# Patient Record
Sex: Female | Born: 1960 | Race: White | Hispanic: No | Marital: Married | Smoking: Never smoker
Health system: Southern US, Community
[De-identification: ages and names within clinical notes are randomized; demographics above are authoritative.]

## PROBLEM LIST (undated history)

## (undated) DIAGNOSIS — Z79890 Hormone replacement therapy (postmenopausal): Secondary | ICD-10-CM

## (undated) DIAGNOSIS — T7840XA Allergy, unspecified, initial encounter: Secondary | ICD-10-CM

## (undated) DIAGNOSIS — R87619 Unspecified abnormal cytological findings in specimens from cervix uteri: Secondary | ICD-10-CM

## (undated) DIAGNOSIS — D219 Benign neoplasm of connective and other soft tissue, unspecified: Secondary | ICD-10-CM

## (undated) DIAGNOSIS — K219 Gastro-esophageal reflux disease without esophagitis: Secondary | ICD-10-CM

## (undated) DIAGNOSIS — I10 Essential (primary) hypertension: Secondary | ICD-10-CM

## (undated) DIAGNOSIS — F419 Anxiety disorder, unspecified: Secondary | ICD-10-CM

## (undated) DIAGNOSIS — F329 Major depressive disorder, single episode, unspecified: Secondary | ICD-10-CM

## (undated) DIAGNOSIS — F32A Depression, unspecified: Secondary | ICD-10-CM

## (undated) DIAGNOSIS — Z5189 Encounter for other specified aftercare: Secondary | ICD-10-CM

## (undated) DIAGNOSIS — M1990 Unspecified osteoarthritis, unspecified site: Secondary | ICD-10-CM

## (undated) HISTORY — DX: Unspecified osteoarthritis, unspecified site: M19.90

## (undated) HISTORY — DX: Essential (primary) hypertension: I10

## (undated) HISTORY — DX: Anxiety disorder, unspecified: F41.9

## (undated) HISTORY — DX: Benign neoplasm of connective and other soft tissue, unspecified: D21.9

## (undated) HISTORY — PX: EYE SURGERY: SHX253

## (undated) HISTORY — DX: Hormone replacement therapy: Z79.890

## (undated) HISTORY — DX: Allergy, unspecified, initial encounter: T78.40XA

## (undated) HISTORY — DX: Gastro-esophageal reflux disease without esophagitis: K21.9

## (undated) HISTORY — DX: Encounter for other specified aftercare: Z51.89

## (undated) HISTORY — DX: Unspecified abnormal cytological findings in specimens from cervix uteri: R87.619

## (undated) HISTORY — DX: Depression, unspecified: F32.A

## (undated) HISTORY — PX: ARM WOUND REPAIR / CLOSURE: SUR1141

## (undated) HISTORY — DX: Major depressive disorder, single episode, unspecified: F32.9

---

## 1991-10-15 HISTORY — PX: KNEE ARTHROSCOPY: SUR90

## 1995-10-15 HISTORY — PX: TUBAL LIGATION: SHX77

## 1998-04-09 ENCOUNTER — Emergency Department (HOSPITAL_COMMUNITY): Admission: EM | Admit: 1998-04-09 | Discharge: 1998-04-09 | Payer: Self-pay | Admitting: Emergency Medicine

## 1999-03-21 ENCOUNTER — Other Ambulatory Visit: Admission: RE | Admit: 1999-03-21 | Discharge: 1999-03-21 | Payer: Self-pay | Admitting: *Deleted

## 1999-05-03 ENCOUNTER — Encounter: Admission: RE | Admit: 1999-05-03 | Discharge: 1999-05-16 | Payer: Self-pay | Admitting: *Deleted

## 2003-12-09 ENCOUNTER — Other Ambulatory Visit: Admission: RE | Admit: 2003-12-09 | Discharge: 2003-12-09 | Payer: Self-pay | Admitting: Obstetrics and Gynecology

## 2005-02-28 ENCOUNTER — Other Ambulatory Visit: Admission: RE | Admit: 2005-02-28 | Discharge: 2005-02-28 | Payer: Self-pay | Admitting: Obstetrics and Gynecology

## 2006-04-07 ENCOUNTER — Other Ambulatory Visit: Admission: RE | Admit: 2006-04-07 | Discharge: 2006-04-07 | Payer: Self-pay | Admitting: Obstetrics & Gynecology

## 2007-06-12 ENCOUNTER — Other Ambulatory Visit: Admission: RE | Admit: 2007-06-12 | Discharge: 2007-06-12 | Payer: Self-pay | Admitting: Obstetrics and Gynecology

## 2008-06-22 ENCOUNTER — Other Ambulatory Visit: Admission: RE | Admit: 2008-06-22 | Discharge: 2008-06-22 | Payer: Self-pay | Admitting: Obstetrics and Gynecology

## 2009-10-29 ENCOUNTER — Ambulatory Visit: Payer: Self-pay | Admitting: Diagnostic Radiology

## 2009-10-29 ENCOUNTER — Emergency Department (HOSPITAL_BASED_OUTPATIENT_CLINIC_OR_DEPARTMENT_OTHER): Admission: EM | Admit: 2009-10-29 | Discharge: 2009-10-29 | Payer: Self-pay | Admitting: Emergency Medicine

## 2011-09-18 ENCOUNTER — Ambulatory Visit (INDEPENDENT_AMBULATORY_CARE_PROVIDER_SITE_OTHER): Payer: BC Managed Care – PPO

## 2011-09-18 DIAGNOSIS — R05 Cough: Secondary | ICD-10-CM

## 2011-09-18 DIAGNOSIS — R059 Cough, unspecified: Secondary | ICD-10-CM

## 2011-09-18 DIAGNOSIS — J189 Pneumonia, unspecified organism: Secondary | ICD-10-CM

## 2011-09-23 ENCOUNTER — Ambulatory Visit (INDEPENDENT_AMBULATORY_CARE_PROVIDER_SITE_OTHER): Payer: BC Managed Care – PPO

## 2011-09-23 DIAGNOSIS — R51 Headache: Secondary | ICD-10-CM

## 2011-09-23 DIAGNOSIS — R059 Cough, unspecified: Secondary | ICD-10-CM

## 2011-09-23 DIAGNOSIS — R05 Cough: Secondary | ICD-10-CM

## 2011-09-23 DIAGNOSIS — J159 Unspecified bacterial pneumonia: Secondary | ICD-10-CM

## 2011-11-12 ENCOUNTER — Ambulatory Visit (INDEPENDENT_AMBULATORY_CARE_PROVIDER_SITE_OTHER): Payer: BC Managed Care – PPO | Admitting: Internal Medicine

## 2011-11-12 ENCOUNTER — Ambulatory Visit: Admission: RE | Admit: 2011-11-12 | Payer: Self-pay | Source: Ambulatory Visit

## 2011-11-12 ENCOUNTER — Ambulatory Visit: Payer: BC Managed Care – PPO

## 2011-11-12 VITALS — BP 134/74 | HR 60 | Temp 98.9°F | Resp 16 | Ht 65.25 in | Wt 200.8 lb

## 2011-11-12 DIAGNOSIS — R059 Cough, unspecified: Secondary | ICD-10-CM

## 2011-11-12 DIAGNOSIS — R05 Cough: Secondary | ICD-10-CM

## 2011-11-12 DIAGNOSIS — Z79890 Hormone replacement therapy (postmenopausal): Secondary | ICD-10-CM | POA: Insufficient documentation

## 2011-11-12 DIAGNOSIS — R053 Chronic cough: Secondary | ICD-10-CM

## 2011-11-12 DIAGNOSIS — F411 Generalized anxiety disorder: Secondary | ICD-10-CM

## 2011-11-12 DIAGNOSIS — F32A Depression, unspecified: Secondary | ICD-10-CM | POA: Insufficient documentation

## 2011-11-12 DIAGNOSIS — I10 Essential (primary) hypertension: Secondary | ICD-10-CM

## 2011-11-12 DIAGNOSIS — F3289 Other specified depressive episodes: Secondary | ICD-10-CM

## 2011-11-12 DIAGNOSIS — F329 Major depressive disorder, single episode, unspecified: Secondary | ICD-10-CM

## 2011-11-12 DIAGNOSIS — F419 Anxiety disorder, unspecified: Secondary | ICD-10-CM

## 2011-11-12 HISTORY — DX: Hormone replacement therapy: Z79.890

## 2011-11-12 NOTE — Progress Notes (Signed)
Jillian Steele is a 51 year old female who complains of a persistent cough with fatigue over the past 2 months. She was treated for sinusitis in November and pneumonia in December and it is now time to repeat her chest x-ray to see if the pneumonia cleared. She continues to be congested in the head and neck areas and complained of shortness of breath with activity and cough both day and night. The cough is not productive but is uncomfortable. She has a history of wheezing with these illnesses and responded to prednisone in December. She has not lost weight. She is not having night sweats except for those associated with her known menopause. She is a nonsmoker.  It is interesting that she also suffers from depression and is on Lexapro. She claims great stress over the past 2 or 3 months do to her daughter's upcoming wedding. She has to pay for this and she has no money and her daughter as not being very helpful in this regard. This sometimes interferes with her sleep as does her nocturnal cough.  Physical examination reveals a healthy woman who is overweight. Her eyes ears and throat are clear. Her nose is very congested and there is purulent discharge. Her sinuses are tender in both maxillary and frontal areas. Her lungs are clear to auscultation and there is no wheezing except at the very end of forced expiration. Her heart exam is normal.  Problem #1 prolonged cough  Problem #2 sinusitis  Problem #3 reactive airway disease  Problem #4 depression  Problem #5 obesity  Problem #6 hormone replacement therapy  Problem #7 hypertension  X-ray of her chest was obtained and reveals that the pneumonia has cleared. There is no new infiltrate.  The plan is to treat her with prednisone amoxicillin and Tussionex and followup if not successful.

## 2011-11-28 ENCOUNTER — Ambulatory Visit: Payer: BC Managed Care – PPO

## 2011-11-28 ENCOUNTER — Ambulatory Visit (INDEPENDENT_AMBULATORY_CARE_PROVIDER_SITE_OTHER): Payer: BC Managed Care – PPO | Admitting: Emergency Medicine

## 2011-11-28 VITALS — BP 139/86 | HR 73 | Temp 98.4°F | Resp 16 | Ht 65.25 in | Wt 206.6 lb

## 2011-11-28 DIAGNOSIS — I10 Essential (primary) hypertension: Secondary | ICD-10-CM

## 2011-11-28 DIAGNOSIS — M549 Dorsalgia, unspecified: Secondary | ICD-10-CM

## 2011-11-28 DIAGNOSIS — F32A Depression, unspecified: Secondary | ICD-10-CM

## 2011-11-28 DIAGNOSIS — F329 Major depressive disorder, single episode, unspecified: Secondary | ICD-10-CM

## 2011-11-28 MED ORDER — LISINOPRIL 10 MG PO TABS: 10.0000 mg | ORAL_TABLET | Freq: Every day | ORAL | Status: DC

## 2011-11-28 MED ORDER — ESCITALOPRAM OXALATE 20 MG PO TABS: 20.0000 mg | ORAL_TABLET | Freq: Every day | ORAL | Status: DC

## 2011-11-28 MED ORDER — MELOXICAM 15 MG PO TABS: 15.0000 mg | ORAL_TABLET | Freq: Every day | ORAL | Status: DC

## 2011-11-28 MED ORDER — METHOCARBAMOL 500 MG PO TABS: 500.0000 mg | ORAL_TABLET | Freq: Four times a day (QID) | ORAL | Status: DC

## 2011-11-28 NOTE — Progress Notes (Signed)
  Subjective:    Patient ID: Jillian Steele, female    DOB: [birth year], 51 y.o.   MRN: [id number]  HPI patient presents with onset Tuesday of pain in her lower back. Apparently patient leaned over with their cat litter box and felt a pain in her lower back . The pain originates in her LS spine and radiates down her left leg.    Review of Systems patient also needs refills on her Lexapro and lisinopril.     Objective:   Physical Exam physical exam reveals tenderness over the lower lumbar spine especially on the left. There is some radicular pain into the left leg. Motor strength is 5 out of 5. Deep tendon reflexes are 0-1 but symmetrical.        Assessment & Plan:  Assessment as LS strain with some radicular symptoms into the left leg. We'll try and get a film over her back. Also refill her medications of

## 2011-11-28 NOTE — Progress Notes (Signed)
Addended by: Lesle Chris A on: 11/28/2011 09:13 AM   Modules accepted: Orders

## 2011-11-28 NOTE — Patient Instructions (Signed)
Back Pain, Adult Low back pain is very common. About 1 in 5 people have back pain.The cause of low back pain is rarely dangerous. The pain often gets better over time.About half of people with a sudden onset of back pain feel better in just 2 weeks. About 8 in 10 people feel better by 6 weeks.  CAUSES Some common causes of back pain include:  Strain of the muscles or ligaments supporting the spine.   Wear and tear (degeneration) of the spinal discs.   Arthritis.   Direct injury to the back.  DIAGNOSIS Most of the time, the direct cause of low back pain is not known.However, back pain can be treated effectively even when the exact cause of the pain is unknown.Answering your caregiver's questions about your overall health and symptoms is one of the most accurate ways to make sure the cause of your pain is not dangerous. If your caregiver needs more information, he or she may order lab work or imaging tests (X-rays or MRIs).However, even if imaging tests show changes in your back, this usually does not require surgery. HOME CARE INSTRUCTIONS For many people, back pain returns.Since low back pain is rarely dangerous, it is often a condition that people can learn to manageon their own.   Remain active. It is stressful on the back to sit or stand in one place. Do not sit, drive, or stand in one place for more than 30 minutes at a time. Take short walks on level surfaces as soon as pain allows.Try to increase the length of time you walk each day.   Do not stay in bed.Resting more than 1 or 2 days can delay your recovery.   Do not avoid exercise or work.Your body is made to move.It is not dangerous to be active, even though your back may hurt.Your back will likely heal faster if you return to being active before your pain is gone.   Pay attention to your body when you bend and lift. Many people have less discomfortwhen lifting if they bend their knees, keep the load close to their  bodies,and avoid twisting. Often, the most comfortable positions are those that put less stress on your recovering back.   Find a comfortable position to sleep. Use a firm mattress and lie on your side with your knees slightly bent. If you lie on your back, put a pillow under your knees.   Only take over-the-counter or prescription medicines as directed by your caregiver. Over-the-counter medicines to reduce pain and inflammation are often the most helpful.Your caregiver may prescribe muscle relaxant drugs.These medicines help dull your pain so you can more quickly return to your normal activities and healthy exercise.   Put ice on the injured area.   Put ice in a plastic bag.   Place a towel between your skin and the bag.   Leave the ice on for 15 to 20 minutes, 3 to 4 times a day for the first 2 to 3 days. After that, ice and heat may be alternated to reduce pain and spasms.   Ask your caregiver about trying back exercises and gentle massage. This may be of some benefit.   Avoid feeling anxious or stressed.Stress increases muscle tension and can worsen back pain.It is important to recognize when you are anxious or stressed and learn ways to manage it.Exercise is a great option.  SEEK MEDICAL CARE IF:  You have pain that is not relieved with rest or medicine.   You have   pain that does not improve in 1 week.   You have new symptoms.   You are generally not feeling well.  SEEK IMMEDIATE MEDICAL CARE IF:   You have pain that radiates from your back into your legs.   You develop new bowel or bladder control problems.   You have unusual weakness or numbness in your arms or legs.   You develop nausea or vomiting.   You develop abdominal pain.   You feel faint.  Document Released: 09/30/2005 Document Revised: 06/12/2011 Document Reviewed: 02/18/2011 [employer] Patient Information 2012 [employer]. 

## 2011-11-29 ENCOUNTER — Other Ambulatory Visit: Payer: Self-pay | Admitting: Family Medicine

## 2011-11-29 DIAGNOSIS — F32A Depression, unspecified: Secondary | ICD-10-CM

## 2011-11-29 DIAGNOSIS — F329 Major depressive disorder, single episode, unspecified: Secondary | ICD-10-CM

## 2011-11-29 DIAGNOSIS — M549 Dorsalgia, unspecified: Secondary | ICD-10-CM

## 2011-11-29 DIAGNOSIS — I10 Essential (primary) hypertension: Secondary | ICD-10-CM

## 2011-11-29 MED ORDER — METHOCARBAMOL 500 MG PO TABS: 500.0000 mg | ORAL_TABLET | Freq: Four times a day (QID) | ORAL | Status: AC

## 2011-11-29 MED ORDER — MELOXICAM 15 MG PO TABS: 15.0000 mg | ORAL_TABLET | Freq: Every day | ORAL | Status: DC

## 2011-11-29 MED ORDER — ESCITALOPRAM OXALATE 20 MG PO TABS: 20.0000 mg | ORAL_TABLET | Freq: Every day | ORAL | Status: DC

## 2011-11-29 MED ORDER — LISINOPRIL 10 MG PO TABS: 10.0000 mg | ORAL_TABLET | Freq: Every day | ORAL | Status: DC

## 2011-12-26 ENCOUNTER — Other Ambulatory Visit: Payer: Self-pay | Admitting: Physician Assistant

## 2011-12-26 DIAGNOSIS — F329 Major depressive disorder, single episode, unspecified: Secondary | ICD-10-CM

## 2011-12-26 DIAGNOSIS — F32A Depression, unspecified: Secondary | ICD-10-CM

## 2011-12-26 DIAGNOSIS — I10 Essential (primary) hypertension: Secondary | ICD-10-CM

## 2011-12-26 MED ORDER — ESCITALOPRAM OXALATE 20 MG PO TABS: 20.0000 mg | ORAL_TABLET | Freq: Every day | ORAL | Status: DC

## 2011-12-26 MED ORDER — LISINOPRIL 10 MG PO TABS: 10.0000 mg | ORAL_TABLET | Freq: Every day | ORAL | Status: DC

## 2012-10-06 ENCOUNTER — Ambulatory Visit (INDEPENDENT_AMBULATORY_CARE_PROVIDER_SITE_OTHER): Payer: BC Managed Care – PPO | Admitting: Emergency Medicine

## 2012-10-06 ENCOUNTER — Ambulatory Visit: Payer: BC Managed Care – PPO

## 2012-10-06 VITALS — BP 134/64 | HR 60 | Temp 98.4°F | Resp 16 | Ht 65.5 in | Wt 205.0 lb

## 2012-10-06 DIAGNOSIS — M79671 Pain in right foot: Secondary | ICD-10-CM

## 2012-10-06 DIAGNOSIS — M25562 Pain in left knee: Secondary | ICD-10-CM

## 2012-10-06 DIAGNOSIS — M722 Plantar fascial fibromatosis: Secondary | ICD-10-CM

## 2012-10-06 DIAGNOSIS — M25569 Pain in unspecified knee: Secondary | ICD-10-CM

## 2012-10-06 DIAGNOSIS — M79609 Pain in unspecified limb: Secondary | ICD-10-CM

## 2012-10-06 MED ORDER — MELOXICAM 15 MG PO TABS: 15.0000 mg | ORAL_TABLET | Freq: Every day | ORAL | Status: DC

## 2012-10-06 NOTE — Patient Instructions (Addendum)
Begin taking Mobic (meloxicam daily) for pain/inflammation.  Do not take any additional Advil/Motrin/ibuprofen/Aleve while taking this.  Let's do the Mobic daily for 2-3 weeks.  I have put in a referral to [facility] for your knee.  I have also referred you for physical therapy at [facility] for the plantar fasciitis of your right foot.  In the mean time, ice and elevation for 15-20 minutes at a time, 3 times per day.  Begin doing the exercises that I gave you.  May also consider a night splint.

## 2012-10-06 NOTE — Progress Notes (Signed)
Subjective:    Patient ID: Elige Radon, female    DOB: 15-May-1961, 51 y.o.   MRN: [id number]  HPI   Ms. Hinz is a very pleasant 51 yr old female here with left knee and right heel pain.  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  She had an arthroscopic surgery for this in 1996.  She was evaluated at [facility] about a year ago and had an MRI.  At that time, it was felt that the damage was not bad enough to warrant further surgery.  Over the last several months patient has felt that the knee pain is worsening.  Often feels like the knee is going to give out.  States that over the last 6 months she has actually fallen a couple times because the knee has given out.  Would like a referral back to [facility] but would like to see a different doctor if possible.  Right foot - pt has known plantar fasciitis.  Symptoms have flared up in the last couple months but have worsened in the last several days.  She was actually in [place] yesterday but came home early due to the pain.  Has been using ice and ibuprofen.  Tries to stretch the calf when possible.  Works 10 hours per day on her feet.  Has to wear work-approved shoes, which are not very supportive.  Very concerned about a heel spur, would like an x-ray today for that reason.     Review of Systems  Constitutional: Negative for fever and chills.  HENT: Negative.   Respiratory: Negative.   Cardiovascular: Negative.   Gastrointestinal: Negative.   Musculoskeletal: Positive for arthralgias (left knee, right foot).  Neurological: Negative.        Objective:   Physical Exam  Constitutional: She is oriented to person, place, and time. She appears well-developed and well-nourished. No distress.  HENT:  Head: Normocephalic and atraumatic.  Eyes: Conjunctivae normal are normal. No scleral icterus.  Pulmonary/Chest: Effort normal.  Musculoskeletal:       Right knee: Normal.       Left knee: She exhibits  normal range of motion, no swelling, no effusion, no ecchymosis, no deformity, no erythema, no LCL laxity, normal patellar mobility, no bony tenderness and no MCL laxity. tenderness (pes anserine bursa) found. No MCL, no LCL and no patellar tendon tenderness noted.       Right ankle: Normal. Achilles tendon normal.       Left ankle: Normal.       Right foot: She exhibits tenderness (heel; insertion of plantar fascia). She exhibits normal range of motion, no bony tenderness, no swelling, normal capillary refill and no deformity.       Left foot: Normal.       TTP only over pes anserine bursa; ROM is full; negative anterior drawer, negative Lachman; negative McMurray  Neurological: She is alert and oriented to person, place, and time.  Skin: Skin is warm and dry.  Psychiatric: She has a normal mood and affect. Her behavior is normal.      Filed Vitals:   10/06/12 0819  BP: 134/64  Pulse: 60  Temp: 98.4 F (36.9 C)  Resp: 16      [facility] reading (PRIMARY) by  Dr. Cleta Alberts - heel spur      Assessment & Plan:   1. Pain of right heel  DG Foot Complete Right, meloxicam (MOBIC) 15 MG tablet, Ambulatory referral to Physical Therapy  2.  Pain in left knee  meloxicam (MOBIC) 15 MG tablet, Ambulatory referral to Orthopedic Surgery  3. Plantar fasciitis of right foot  meloxicam (MOBIC) 15 MG tablet, Ambulatory referral to Physical Therapy     Ms. Lohmann is a very pleasant 51 yr old female with left knee pain and plantar fasciitis of the right foot.  Knee exam was largely normal aside from TTP of pes anserine bursa.  Suspect bursitis could be contributing to her pain.  Given her complaint of the knee giving out though, will refer her back to [facility] for further evaluation.    Mobic daily for pain/inflammation of the knee/plantar fascia  PT referral for plantar fasciitis.  I have given her stretches/exercise to do in the mean time.  Discussed the possibility of using a night splint as  well.  Pt may buy at medical supply store or online.  Will do PT at [facility] for continuity.  Pt will RTC if worsening or not improving.

## 2012-11-29 ENCOUNTER — Ambulatory Visit (INDEPENDENT_AMBULATORY_CARE_PROVIDER_SITE_OTHER): Payer: BC Managed Care – PPO | Admitting: Physician Assistant

## 2012-11-29 VITALS — BP 134/90 | HR 98 | Temp 98.3°F | Resp 16 | Ht 66.0 in | Wt 207.8 lb

## 2012-11-29 DIAGNOSIS — H9319 Tinnitus, unspecified ear: Secondary | ICD-10-CM

## 2012-11-29 DIAGNOSIS — J329 Chronic sinusitis, unspecified: Secondary | ICD-10-CM

## 2012-11-29 DIAGNOSIS — R51 Headache: Secondary | ICD-10-CM

## 2012-11-29 MED ORDER — AMOXICILLIN-POT CLAVULANATE 875-125 MG PO TABS: 1.0000 | ORAL_TABLET | Freq: Two times a day (BID) | ORAL | Status: DC

## 2012-11-29 NOTE — Progress Notes (Signed)
Subjective:    Patient ID: Elige Radon, female    DOB: 1960-12-07, 52 y.o.   MRN: [id number]  HPI   Ms. Grumbine is a 52 yr old female here with concern for sinusitis.  "Feels like I'm in a drum."  States she feels "woozy".  Denies dizziness or vertigo, but states she feels somewhat off balance.  States she has a roaring in both ears. Denies hearing loss.  Describes it as Paramedic".  This has been off and on for about a week.  Feels pressure in frontal sinuses.  Denies nasal congestion or nasal drainage.  No cough, SOB, wheezing, fever, chills, sore throat, or ear pain.  No numbness, tingling, weakness.  States that she is not really prone to sinus infections but that this feels like her previous episodes of sinusitis.  Has been using Alka Seltzer Plus but this is not helping.     Review of Systems  Constitutional: Negative for fever and chills.  HENT: Positive for postnasal drip, sinus pressure (frontal) and tinnitus. Negative for congestion, sore throat, rhinorrhea, neck pain and neck stiffness.   Respiratory: Negative for cough, shortness of breath and wheezing.   Cardiovascular: Negative.   Gastrointestinal: Negative.   Musculoskeletal: Negative.   Skin: Negative.   Neurological: Positive for dizziness and headaches. Negative for weakness, light-headedness and numbness.       Objective:   Physical Exam  Vitals reviewed. Constitutional: She is oriented to person, place, and time. She appears well-developed and well-nourished. No distress.  HENT:  Head: Normocephalic and atraumatic.  Right Ear: Tympanic membrane and ear canal normal.  Left Ear: Tympanic membrane and ear canal normal.  Nose: Right sinus exhibits frontal sinus tenderness. Right sinus exhibits no maxillary sinus tenderness. Left sinus exhibits frontal sinus tenderness. Left sinus exhibits no maxillary sinus tenderness.  Mouth/Throat: Uvula is midline, oropharynx is clear and moist and mucous membranes are normal.  Eyes:  Conjunctivae are normal. No scleral icterus.  Neck: Neck supple.  Cardiovascular: Normal rate, regular rhythm and normal heart sounds.  Exam reveals no gallop and no friction rub.   No murmur heard. Pulmonary/Chest: Effort normal and breath sounds normal. She has no wheezes. She has no rales.  Lymphadenopathy:    She has no cervical adenopathy.  Neurological: She is alert and oriented to person, place, and time.  Skin: Skin is warm and dry.  Psychiatric: She has a normal mood and affect. Her behavior is normal.     Filed Vitals:   11/29/12 1648  BP: 134/90  Pulse: 98  Temp: 98.3 F (36.8 C)  Resp: 16        Assessment & Plan:  Sinusitis - Plan: amoxicillin-clavulanate (AUGMENTIN) 875-125 MG per tablet  Headache  Tinnitus   Ms. Gilmore is a pleasant 52 yr old female here with headache and roaring tinnitus.  Pt denies dizziness or vertigo.  She has concern for sinus infection, and states that this episode feels similar to previous episodes of sinusitis.  This certainly is not a classic presentation of sinusitis, but I think it's reasonable to try a course of abx, esp since that has helped in the past.  I certainly have some concern for other causes of tinnitus, particularly Meniere's disease as she also has a sense of being off balance.  She does not have any focal neurologic deficits.  Discussed with pt that if abx do not resolve the problem we will need to investigate further.  If pt is worsening, not improving, or  developing new symptoms, she will RTC.

## 2012-11-29 NOTE — Patient Instructions (Addendum)
Begin taking the Augmentin as directed.  Take with food to reduce stomach upset.  Be sure to finish the full course.  Plenty of fluids.  Please let us know if worsening or not improving   Sinusitis Sinusitis is redness, soreness, and swelling (inflammation) of the paranasal sinuses. Paranasal sinuses are air pockets within the bones of your face (beneath the eyes, the middle of the forehead, or above the eyes). In healthy paranasal sinuses, mucus is able to drain out, and air is able to circulate through them by way of your nose. However, when your paranasal sinuses are inflamed, mucus and air can become trapped. This can allow bacteria and other germs to grow and cause infection. Sinusitis can develop quickly and last only a short time (acute) or continue over a long period (chronic). Sinusitis that lasts for more than 12 weeks is considered chronic.  CAUSES  Causes of sinusitis include:  Allergies.  Structural abnormalities, such as displacement of the cartilage that separates your nostrils (deviated septum), which can decrease the air flow through your nose and sinuses and affect sinus drainage.  Functional abnormalities, such as when the small hairs (cilia) that line your sinuses and help remove mucus do not work properly or are not present. SYMPTOMS  Symptoms of acute and chronic sinusitis are the same. The primary symptoms are pain and pressure around the affected sinuses. Other symptoms include:  Upper toothache.  Earache.  Headache.  Bad breath.  Decreased sense of smell and taste.  A cough, which worsens when you are lying flat.  Fatigue.  Fever.  Thick drainage from your nose, which often is green and may contain pus (purulent).  Swelling and warmth over the affected sinuses. DIAGNOSIS  Your caregiver will perform a physical exam. During the exam, your caregiver may:  Look in your nose for signs of abnormal growths in your nostrils (nasal polyps).  Tap over the  affected sinus to check for signs of infection.  View the inside of your sinuses (endoscopy) with a special imaging device with a light attached (endoscope), which is inserted into your sinuses. If your caregiver suspects that you have chronic sinusitis, one or more of the following tests may be recommended:  Allergy tests.  Nasal culture A sample of mucus is taken from your nose and sent to a lab and screened for bacteria.  Nasal cytology A sample of mucus is taken from your nose and examined by your caregiver to determine if your sinusitis is related to an allergy. TREATMENT  Most cases of acute sinusitis are related to a viral infection and will resolve on their own within 10 days. Sometimes medicines are prescribed to help relieve symptoms (pain medicine, decongestants, nasal steroid sprays, or saline sprays).  However, for sinusitis related to a bacterial infection, your caregiver will prescribe antibiotic medicines. These are medicines that will help kill the bacteria causing the infection.  Rarely, sinusitis is caused by a fungal infection. In theses cases, your caregiver will prescribe antifungal medicine. For some cases of chronic sinusitis, surgery is needed. Generally, these are cases in which sinusitis recurs more than 3 times per year, despite other treatments. HOME CARE INSTRUCTIONS   Drink plenty of water. Water helps thin the mucus so your sinuses can drain more easily.  Use a humidifier.  Inhale steam 3 to 4 times a day (for example, sit in the bathroom with the shower running).  Apply a warm, moist washcloth to your face 3 to 4 times a day,  or as directed by your caregiver.  Use saline nasal sprays to help moisten and clean your sinuses.  Take over-the-counter or prescription medicines for pain, discomfort, or fever only as directed by your caregiver. SEEK IMMEDIATE MEDICAL CARE IF:  You have increasing pain or severe headaches.  You have nausea, vomiting, or  drowsiness.  You have swelling around your face.  You have vision problems.  You have a stiff neck.  You have difficulty breathing. MAKE SURE YOU:   Understand these instructions.  Will watch your condition.  Will get help right away if you are not doing well or get worse. Document Released: 09/30/2005 Document Revised: 12/23/2011 Document Reviewed: 10/15/2011 [facility] Patient Information 2013 [place], [place].

## 2012-12-25 ENCOUNTER — Other Ambulatory Visit: Payer: Self-pay | Admitting: Gynecology

## 2012-12-25 LAB — BASIC METABOLIC PANEL
BUN: 14 mg/dL (ref 4–21)
Creatinine: 0.8 mg/dL (ref 0.5–1.1)
Potassium: 4.3 mmol/L (ref 3.4–5.3)

## 2012-12-25 LAB — LIPID PANEL
Cholesterol: 181 mg/dL (ref 0–200)
HDL: 49 mg/dL (ref 35–70)
LDl/HDL Ratio: 3.7
Triglycerides: 92 mg/dL (ref 40–160)

## 2012-12-25 LAB — HEPATIC FUNCTION PANEL
ALT: 23 U/L (ref 7–35)
AST: 16 U/L (ref 13–35)
Alkaline Phosphatase: 90 U/L (ref 25–125)
Bilirubin, Total: 0.5 mg/dL

## 2012-12-29 LAB — HEMOGLOBIN, FINGERSTICK: Hemoglobin, fingerstick: 13.7 g/dL (ref 12.0–16.0)

## 2013-01-08 ENCOUNTER — Telehealth: Payer: Self-pay | Admitting: Radiology

## 2013-01-08 ENCOUNTER — Ambulatory Visit (INDEPENDENT_AMBULATORY_CARE_PROVIDER_SITE_OTHER): Payer: BC Managed Care – PPO | Admitting: Physician Assistant

## 2013-01-08 VITALS — BP 122/82 | HR 77 | Temp 98.3°F | Resp 16 | Ht 66.0 in | Wt 204.0 lb

## 2013-01-08 DIAGNOSIS — F32A Depression, unspecified: Secondary | ICD-10-CM

## 2013-01-08 DIAGNOSIS — F419 Anxiety disorder, unspecified: Secondary | ICD-10-CM

## 2013-01-08 DIAGNOSIS — F329 Major depressive disorder, single episode, unspecified: Secondary | ICD-10-CM

## 2013-01-08 DIAGNOSIS — F411 Generalized anxiety disorder: Secondary | ICD-10-CM

## 2013-01-08 DIAGNOSIS — I10 Essential (primary) hypertension: Secondary | ICD-10-CM

## 2013-01-08 DIAGNOSIS — Z1211 Encounter for screening for malignant neoplasm of colon: Secondary | ICD-10-CM

## 2013-01-08 MED ORDER — ESCITALOPRAM OXALATE 20 MG PO TABS: 20.0000 mg | ORAL_TABLET | Freq: Every day | ORAL | Status: DC

## 2013-01-08 MED ORDER — LISINOPRIL 10 MG PO TABS: 10.0000 mg | ORAL_TABLET | Freq: Every day | ORAL | Status: DC

## 2013-01-08 MED ORDER — BUPROPION HCL ER (XL) 150 MG PO TB24: 150.0000 mg | ORAL_TABLET | Freq: Every day | ORAL | Status: DC

## 2013-01-08 MED ORDER — ALPRAZOLAM 0.5 MG PO TABS: 0.5000 mg | ORAL_TABLET | Freq: Every day | ORAL | Status: DC | PRN

## 2013-01-08 NOTE — Progress Notes (Signed)
Subjective:    Patient ID: Jillian Steele, female    DOB: [birth year], 52 y.o.   MRN: [id number]  HPI  This 52 y.o. female presents for evaluation of HTN, anxiety and depression.  Has had a very stressful year.  Her daughter became engaged and was planning a wedding.  The patient had paid in excess of $10,000 for the wedding, only to discover than her daughter's plan was to NOT get married, rather to make a public statement against the [place] ban on gay marriage. Unfortunately, her daughter didn't understand her frustration, and ultimately they are no longer communicating.  Then, a kitchen renovation took 3 months longer than planned.  Chewing on nails and cuticles. Can't stop.  Even pulled off tips.  She's tolerating the Lexapro 20 mg without adverse effects, and wonders about restarting Wellbutrin, which she has taken with Lexapro previously with good results.  Feels like she's sleeping well, but doesn't wake feeling rested.  Interested in D/C lisinopril now that she's off HRT and her BP is better. Believes that the hormones caused the elevation.  Past Medical History  Diagnosis Date  . Anxiety     Past Surgical History  Procedure Laterality Date  . Eye surgery    . Tubal ligation      Prior to Admission medications   Medication Sig Start Date End Date Taking? Authorizing Provider  ALPRAZolam Prudy Feeler) 0.5 MG tablet Take 0.5 mg by mouth.   Yes Historical Provider, MD  escitalopram (LEXAPRO) 20 MG tablet Take 1 tablet (20 mg total) by mouth daily. 12/26/11  Yes Ryan M Dunn, PA-C  lisinopril (PRINIVIL,ZESTRIL) 10 MG tablet Take 1 tablet (10 mg total) by mouth daily. 12/26/11  Yes Ryan M Dunn, PA-C    No Known Allergies  History   Social History  . Marital Status: Married    Spouse Name: Casimiro Needle    Number of Children: 2  . Years of Education: 12+   Occupational History  . [employer] Designer, television/film set (Surveyor, quantity)     [employer]   Social History Main Topics  . Smoking  status: Never Smoker   . Smokeless tobacco: Never Used  . Alcohol Use: No  . Drug Use: No  . Sexually Active: Not on file   Other Topics Concern  . Not on file   Social History Narrative   Lives with her husband and their dog, Duke.  Their daughters are grown and live independently.    Family History  Problem Relation Age of Onset  . Heart disease Mother   . Stroke Mother   . Thyroid disease Mother   . Heart disease Father   . Heart disease Sister   . Heart disease Brother   . Thyroid disease Brother   . Cancer Paternal Grandmother     lung cancer  . Cancer Paternal Grandfather     prostate cancer   Review of Systems As above.  No chest pain, SOB, HA, dizziness, vision change, N/V, diarrhea, constipation, dysuria, urinary urgency or frequency, myalgias, arthralgias or rash.     Objective:   Physical Exam  Blood pressure 122/82, pulse 77, temperature 98.3 F (36.8 C), temperature source Oral, resp. rate 16, height 5\' 6"  (1.676 m), weight 204 lb (92.534 kg), SpO2 99.00%. Body mass index is 32.94 kg/(m^2). Well-developed, well nourished WF who is awake, alert and oriented, in NAD. HEENT: [place]/AT, PERRL, EOMI.  Sclera and conjunctiva are clear.  EAC are patent, TMs are normal in appearance. Nasal mucosa is  pink and moist. OP is clear. Neck: supple, non-tender, no lymphadenopathy, thyromegaly. Heart: RRR, no murmur Lungs: normal effort, CTA Extremities: no cyanosis, clubbing or edema. Skin: warm and dry without rash. Nails are chewed to the quick.  Cuticles show evidence of trauma as well. Psychologic: good mood and appropriate affect, normal speech and behavior.     Assessment & Plan:  Hypertension - Plan: lisinopril (PRINIVIL,ZESTRIL) 10 MG tablet, DISCONTINUED: lisinopril (PRINIVIL,ZESTRIL) 10 MG tablet  Anxiety/Depression - Plan: escitalopram (LEXAPRO) 20 MG tablet, buPROPion (WELLBUTRIN XL) 150 MG 24 hr tablet, ALPRAZolam (XANAX) 0.5 MG tablet  Screening for colon  cancer - Plan: Ambulatory referral to Gastroenterology  Will pull patient's paper chart for abstraction.  Additionally, contact GYN for her recent lab results there.

## 2013-01-08 NOTE — Telephone Encounter (Signed)
[name], I called about this patient, and the physician has moved to another office. The # there is [phone]. This office is also on epic, however they just went live this week. Patient would need to sign medical records release for [place] to get records. I have advised patient of this. She state she has already requested the records be sent. She states she will call over there on Monday and do this. I advised her if she needs anything further to let me know.

## 2013-01-08 NOTE — Patient Instructions (Signed)
Cut the lisinopril in half, to 5 mg.  Monitor your blood pressure over the next 3 months.  If it is consistently below 140/90, you can stay on 5 mg, or try stopping it.  If it's above 140/90 regularly, please resume the 10 mg dose.

## 2013-01-11 ENCOUNTER — Encounter: Payer: Self-pay | Admitting: Internal Medicine

## 2013-01-20 ENCOUNTER — Encounter: Payer: Self-pay | Admitting: Physician Assistant

## 2013-03-04 ENCOUNTER — Encounter: Payer: Self-pay | Admitting: Internal Medicine

## 2013-03-31 ENCOUNTER — Encounter: Payer: Self-pay | Admitting: Internal Medicine

## 2013-04-30 ENCOUNTER — Encounter: Payer: Self-pay | Admitting: Internal Medicine

## 2013-04-30 ENCOUNTER — Ambulatory Visit (AMBULATORY_SURGERY_CENTER): Payer: BC Managed Care – PPO | Admitting: *Deleted

## 2013-04-30 VITALS — Ht 65.0 in | Wt 205.8 lb

## 2013-04-30 DIAGNOSIS — Z1211 Encounter for screening for malignant neoplasm of colon: Secondary | ICD-10-CM

## 2013-04-30 MED ORDER — MOVIPREP 100 G PO SOLR: ORAL | Status: DC

## 2013-04-30 NOTE — Progress Notes (Signed)
Patient denies any allergies to eggs or soy, and no problems with anesthesia.

## 2013-05-14 ENCOUNTER — Encounter: Payer: Self-pay | Admitting: Internal Medicine

## 2013-05-14 ENCOUNTER — Ambulatory Visit (AMBULATORY_SURGERY_CENTER): Payer: BC Managed Care – PPO | Admitting: Internal Medicine

## 2013-05-14 VITALS — BP 132/84 | HR 62 | Temp 97.9°F | Resp 18 | Ht 65.0 in | Wt 205.0 lb

## 2013-05-14 DIAGNOSIS — Z1211 Encounter for screening for malignant neoplasm of colon: Secondary | ICD-10-CM

## 2013-05-14 DIAGNOSIS — D126 Benign neoplasm of colon, unspecified: Secondary | ICD-10-CM

## 2013-05-14 MED ORDER — SODIUM CHLORIDE 0.9 % IV SOLN: 500.0000 mL | INTRAVENOUS | Status: DC

## 2013-05-14 NOTE — Patient Instructions (Addendum)

## 2013-05-14 NOTE — Op Note (Addendum)
[facility] [address] [place], [address]   COLONOSCOPY PROCEDURE REPORT  PATIENT: Jillian, Steele  MR#: [id number] BIRTHDATE: 02-07-1961 , 51  yrs. old GENDER: Female ENDOSCOPIST: Hart Carwin, MD REFERRED XB:JYNWGN Jeffery,PA-C PROCEDURE DATE:  05/14/2013 PROCEDURE:   Colonoscopy with cold biopsy polypectomy and Colonoscopy with snare polypectomy First Screening Colonoscopy - Avg.  risk and is 50 yrs.  old or older Yes.  Prior Negative Screening - Now for repeat screening. N/A  History of Adenoma - Now for follow-up colonoscopy & has been > or = to 3 yrs.  N/A  Polyps Removed Today? Yes. ASA CLASS:   Class II INDICATIONS:Average risk patient for colon cancer. MEDICATIONS: MAC sedation, administered by CRNA and propofol (Diprivan) 300mg  IV  DESCRIPTION OF PROCEDURE:   After the risks benefits and alternatives of the procedure were thoroughly explained, informed consent was obtained.  A digital rectal exam revealed no abnormalities of the rectum.   The LB [id number]  endoscope was introduced through the anus and advanced to the cecum, which was identified by both the appendix and ileocecal valve. No adverse events experienced.   The quality of the prep was excellent, using MoviPrep  The instrument was then slowly withdrawn as the colon was fully examined.      COLON FINDINGS: Two smooth polyps ranging between 3-68mm in size were found in the ascending colon.  A polypectomy was performed with cold forceps and with a cold snare.  The resection was complete and the polyp tissue was completely retrieved.  Retroflexed views revealed no abnormalities. The time to cecum=4 minutes 4 seconds. Withdrawal time=14.  minutes 52 seconds.  The scope was withdrawn and the procedure completed. COMPLICATIONS: There were no complications.  ENDOSCOPIC IMPRESSION: Two polyps ranging between 3-27mm in size were found in the ascending colon; polypectomy was performed  with cold forceps and with a cold snare  RECOMMENDATIONS: Await pathology results   eSigned:  Hart Carwin, MD 05/14/2013 9:43 AM   cc:   PATIENT NAME:  Jillian, Steele MR#: [id number]

## 2013-05-14 NOTE — Progress Notes (Signed)
Called to room to assist during endoscopic procedure.  Patient ID and intended procedure confirmed with present staff. Received instructions for my participation in the procedure from the performing physician.  

## 2013-05-14 NOTE — Progress Notes (Signed)
Patient did not experience any of the following events: a burn prior to discharge; a fall within the facility; wrong site/side/patient/procedure/implant event; or a hospital transfer or hospital admission upon discharge from the facility. (G8907) Patient did not have preoperative order for IV antibiotic SSI prophylaxis. (G8918)  

## 2013-05-14 NOTE — Progress Notes (Signed)
Report to pacu rn, vss, bbs=clear 

## 2013-05-14 NOTE — Progress Notes (Signed)
Patient denies any allergies to eggs or soy. Patient denies any problems with anesthesia.  

## 2013-05-17 ENCOUNTER — Telehealth: Payer: Self-pay | Admitting: *Deleted

## 2013-05-17 NOTE — Telephone Encounter (Signed)
  Follow up Call-  Call back number 05/14/2013  Post procedure Call Back phone  # [phone]  Permission to leave phone message Yes     Patient questions:  Message left to call us if necessary.

## 2013-05-20 ENCOUNTER — Encounter: Payer: Self-pay | Admitting: Internal Medicine

## 2013-08-03 ENCOUNTER — Ambulatory Visit (INDEPENDENT_AMBULATORY_CARE_PROVIDER_SITE_OTHER): Payer: BC Managed Care – PPO | Admitting: Physician Assistant

## 2013-08-03 ENCOUNTER — Encounter: Payer: Self-pay | Admitting: Physician Assistant

## 2013-08-03 VITALS — BP 124/80 | HR 74 | Temp 98.4°F | Resp 16 | Ht 63.0 in | Wt 204.0 lb

## 2013-08-03 DIAGNOSIS — F329 Major depressive disorder, single episode, unspecified: Secondary | ICD-10-CM

## 2013-08-03 DIAGNOSIS — F411 Generalized anxiety disorder: Secondary | ICD-10-CM

## 2013-08-03 DIAGNOSIS — I10 Essential (primary) hypertension: Secondary | ICD-10-CM

## 2013-08-03 DIAGNOSIS — F32A Depression, unspecified: Secondary | ICD-10-CM

## 2013-08-03 DIAGNOSIS — Z23 Encounter for immunization: Secondary | ICD-10-CM

## 2013-08-03 DIAGNOSIS — F419 Anxiety disorder, unspecified: Secondary | ICD-10-CM

## 2013-08-03 MED ORDER — ESCITALOPRAM OXALATE 20 MG PO TABS: 20.0000 mg | ORAL_TABLET | Freq: Every day | ORAL | Status: DC

## 2013-08-03 MED ORDER — ALPRAZOLAM 0.5 MG PO TABS: 0.5000 mg | ORAL_TABLET | Freq: Every day | ORAL | Status: DC | PRN

## 2013-08-03 MED ORDER — BUPROPION HCL ER (XL) 150 MG PO TB24: 150.0000 mg | ORAL_TABLET | Freq: Every day | ORAL | Status: DC

## 2013-08-03 NOTE — Progress Notes (Signed)
  Subjective:    Patient ID: Jillian Steele, female    DOB: 1961-09-06, 52 y.o.   MRN: [id number]  HPI This 52 y.o. female presents for medication refills.  Has not been taking lisinopril regularly, "only when I remember it," every couple of days.  Declines flu vaccine, stating "If it's an option, I don't want it," and "I don't do needles unless it's required."  Medications, allergies, past medical history, surgical history, family history, social history and problem list reviewed.   Review of Systems No chest pain, SOB, HA, dizziness, vision change, N/V, diarrhea, constipation, dysuria, urinary urgency or frequency, myalgias, arthralgias or rash.     Objective:   Physical Exam Blood pressure 124/80, pulse 74, temperature 98.4 F (36.9 C), temperature source Oral, resp. rate 16, height 5\' 3"  (1.6 m), weight 204 lb (92.534 kg), SpO2 98.00%. Body mass index is 36.15 kg/(m^2). Well-developed, well nourished WF who is awake, alert and oriented, in NAD. HEENT: [place]/AT, sclera and conjunctiva are clear.  EAC are patent, TMs are normal in appearance. Nasal mucosa is pink and moist. OP is clear. Neck: supple, non-tender, no lymphadenopathy, thyromegaly. Heart: RRR, no murmur Lungs: normal effort, CTA Extremities: no cyanosis, clubbing or edema. Skin: warm and dry without rash. Psychologic: good mood and appropriate affect, normal speech and behavior.        Assessment & Plan:  Hypertension - controlled.  She'll stay off lisinopril for now, and measure her BP weekly.  Let me know if readings are consistently above 140/90, and I'll restart lisinopril.  Anxiety/Depression - Plan: ALPRAZolam (XANAX) 0.5 MG tablet, buPROPion (WELLBUTRIN XL) 150 MG 24 hr tablet, escitalopram (LEXAPRO) 20 MG tablet  Need for influenza vaccination - declined, despite counseling.  Encouraged healthy eating and regular exercise for weight loss and cardiovascular risk reduction.  Fernande Bras, PA-C Physician  Assistant-Certified [facility]

## 2013-08-03 NOTE — Patient Instructions (Signed)
Check your blood pressure once a week and record the result. If it runs >140/90 regularly, let me know, and we'll restart the blood pressure medication. Send me the dates of your most recent: tetanus, pap test and mammogram at your convenience.

## 2013-08-19 ENCOUNTER — Other Ambulatory Visit: Payer: Self-pay

## 2013-10-12 ENCOUNTER — Encounter (HOSPITAL_BASED_OUTPATIENT_CLINIC_OR_DEPARTMENT_OTHER): Payer: Self-pay | Admitting: Emergency Medicine

## 2013-10-12 ENCOUNTER — Emergency Department (HOSPITAL_BASED_OUTPATIENT_CLINIC_OR_DEPARTMENT_OTHER)
Admission: EM | Admit: 2013-10-12 | Discharge: 2013-10-12 | Disposition: A | Discharge status: Home / Self Care | Payer: BC Managed Care – PPO | Attending: Emergency Medicine | Admitting: Emergency Medicine

## 2013-10-12 ENCOUNTER — Emergency Department (HOSPITAL_BASED_OUTPATIENT_CLINIC_OR_DEPARTMENT_OTHER): Payer: BC Managed Care – PPO

## 2013-10-12 DIAGNOSIS — J3489 Other specified disorders of nose and nasal sinuses: Secondary | ICD-10-CM | POA: Insufficient documentation

## 2013-10-12 DIAGNOSIS — I10 Essential (primary) hypertension: Secondary | ICD-10-CM | POA: Insufficient documentation

## 2013-10-12 DIAGNOSIS — Z79899 Other long term (current) drug therapy: Secondary | ICD-10-CM | POA: Insufficient documentation

## 2013-10-12 DIAGNOSIS — R0602 Shortness of breath: Secondary | ICD-10-CM | POA: Insufficient documentation

## 2013-10-12 DIAGNOSIS — R0789 Other chest pain: Secondary | ICD-10-CM | POA: Insufficient documentation

## 2013-10-12 DIAGNOSIS — R42 Dizziness and giddiness: Secondary | ICD-10-CM | POA: Insufficient documentation

## 2013-10-12 DIAGNOSIS — F411 Generalized anxiety disorder: Secondary | ICD-10-CM | POA: Insufficient documentation

## 2013-10-12 LAB — CBC WITH DIFFERENTIAL/PLATELET
Basophils Absolute: 0.1 K/uL (ref 0.0–0.1)
Basophils Relative: 1 % (ref 0–1)
Eosinophils Absolute: 0.2 K/uL (ref 0.0–0.7)
Eosinophils Relative: 2 % (ref 0–5)
HCT: 39 % (ref 36.0–46.0)
Hemoglobin: 13.2 g/dL (ref 12.0–15.0)
Lymphocytes Relative: 32 % (ref 12–46)
Lymphs Abs: 2.6 K/uL (ref 0.7–4.0)
MCH: 28.8 pg (ref 26.0–34.0)
MCHC: 33.8 g/dL (ref 30.0–36.0)
MCV: 85 fL (ref 78.0–100.0)
Monocytes Absolute: 0.5 K/uL (ref 0.1–1.0)
Monocytes Relative: 6 % (ref 3–12)
Neutro Abs: 4.9 K/uL (ref 1.7–7.7)
Neutrophils Relative %: 60 % (ref 43–77)
Platelets: 241 K/uL (ref 150–400)
RBC: 4.59 MIL/uL (ref 3.87–5.11)
RDW: 14.2 % (ref 11.5–15.5)
WBC: 8.1 K/uL (ref 4.0–10.5)

## 2013-10-12 LAB — BASIC METABOLIC PANEL
CO2: 25 mEq/L (ref 19–32)
Calcium: 9.5 mg/dL (ref 8.4–10.5)
Chloride: 105 mEq/L (ref 96–112)
Creatinine, Ser: 0.8 mg/dL (ref 0.50–1.10)
GFR calc Af Amer: >90 mL/min (ref >90)
Potassium: 3.8 mEq/L (ref 3.7–5.3)
Sodium: 143 mEq/L (ref 137–147)

## 2013-10-12 LAB — TROPONIN I: Troponin I: <0.3 ng/mL (ref <0.30)

## 2013-10-12 MED ORDER — MECLIZINE HCL 25 MG PO TABS
50.0000 mg | ORAL_TABLET | Freq: Once | ORAL | Status: AC
  Administered 2013-10-12: 50 mg via ORAL
  Filled 2013-10-12: qty 2

## 2013-10-12 MED ORDER — MECLIZINE HCL 25 MG PO TABS: 25.0000 mg | ORAL_TABLET | Freq: Three times a day (TID) | ORAL | Status: DC | PRN

## 2013-10-12 MED ORDER — MECLIZINE HCL 25 MG PO TABS
ORAL_TABLET | ORAL | Status: AC
  Administered 2013-10-12: 50 mg via ORAL
  Filled 2013-10-12: qty 1

## 2013-10-12 NOTE — ED Provider Notes (Addendum)
Medical screening examination/treatment/procedure(s) were performed by non-physician practitioner and as supervising physician I was immediately available for consultation/collaboration.  EKG Interpretation    Date/Time:  Tuesday October 12 2013 18:29:01 EST Ventricular Rate:  76 PR Interval:  188 QRS Duration: 86 QT Interval:  384 QTC Calculation: 432 R Axis:   33 Text Interpretation:  Sinus rhythm with marked sinus arrhythmia Nonspecific T wave abnormality Abnormal ECG No old tracing to compare Confirmed by [facility]  MD, MARTHA [phone]) on 10/12/2013 9:46:09 PM             Ethelda Chick, MD 10/12/13 1946  Ethelda Chick, MD 10/12/13 2146

## 2013-10-12 NOTE — ED Provider Notes (Signed)
CSN: [id number]     Arrival date & time 10/12/13  1739 History   First MD Initiated Contact with Patient 10/12/13 1754     Chief Complaint  Patient presents with  . Dizziness   (Consider location/radiation/quality/duration/timing/severity/associated sxs/prior Treatment) Patient is a 52 y.o. female presenting with dizziness. The history is provided by the patient. No language interpreter was used.  Dizziness Quality:  Room spinning Severity:  Mild Onset quality:  Gradual Duration:  1 day Associated symptoms: shortness of breath   Associated symptoms: no chest pain, no nausea and no vomiting   Associated symptoms comment:  Nasal congestion and sinus pressure for the past 3 days, today with dizziness she describes as "room is spinning". It is worse with lying flat or with getting up from a reclined position. No history of vertigo. She denies headache or fever. No nausea or vomiting. She also denies cough. Since yesterday she has a burning sensation in her chest with bilateral chest tightness causing a mild SOB. No heart disease history, no diaphoresis.    Past Medical History  Diagnosis Date  . Anxiety   . Hypertension    Past Surgical History  Procedure Laterality Date  . Eye surgery      Lasik  . Tubal ligation    . Knee arthroscopy    . Arm wound repair / closure      left   Family History  Problem Relation Age of Onset  . Heart disease Mother   . Stroke Mother   . Thyroid disease Mother   . Heart disease Father   . Heart disease Sister   . Heart disease Brother   . Thyroid disease Brother   . Cancer Paternal Grandmother     lung cancer  . Cancer Paternal Grandfather     prostate cancer  . Prostate cancer Paternal Uncle   . Colon cancer Neg Hx   . Nephrolithiasis Daughter    History  Substance Use Topics  . Smoking status: Never Smoker   . Smokeless tobacco: Never Used  . Alcohol Use: No   OB History   Grav Para Term Preterm Abortions TAB SAB Ect Mult Living                  Review of Systems  Constitutional: Negative for fever and chills.  HENT: Positive for congestion and sinus pressure.   Respiratory: Positive for chest tightness and shortness of breath. Negative for cough.   Cardiovascular: Negative.  Negative for chest pain.  Gastrointestinal: Negative.  Negative for nausea, vomiting and abdominal pain.  Genitourinary: Negative for dysuria.  Musculoskeletal: Negative.  Negative for myalgias.  Skin: Negative.   Neurological: Positive for dizziness. Negative for syncope and weakness.    Allergies  Review of patient's allergies indicates no known allergies.  Home Medications   Current Outpatient Rx  Name  Route  Sig  Dispense  Refill  . ALPRAZolam (XANAX) 0.5 MG tablet   Oral   Take 1 tablet (0.5 mg total) by mouth daily as needed for anxiety.   30 tablet   0   . buPROPion (WELLBUTRIN XL) 150 MG 24 hr tablet   Oral   Take 1 tablet (150 mg total) by mouth daily.   90 tablet   3   . escitalopram (LEXAPRO) 20 MG tablet   Oral   Take 1 tablet (20 mg total) by mouth daily.   90 tablet   3    BP 155/86  Pulse 90  Temp(Src) 98.1 F (36.7 C) (Oral)  Resp 18  Ht 5\' 4"  (1.626 m)  Wt 200 lb (90.719 kg)  BMI 34.31 kg/m2  SpO2 100% Physical Exam  Constitutional: She is oriented to person, place, and time. She appears well-developed and well-nourished.  HENT:  Head: Normocephalic.  Eyes: Pupils are equal, round, and reactive to light.  Neck: Normal range of motion. Neck supple.  Cardiovascular: Normal rate and regular rhythm.   Pulmonary/Chest: Effort normal and breath sounds normal.  Abdominal: Soft. Bowel sounds are normal. There is no tenderness. There is no rebound and no guarding.  Musculoskeletal: Normal range of motion.  Neurological: She is alert and oriented to person, place, and time. She has normal strength and normal reflexes. No sensory deficit. She displays a negative Romberg sign. Coordination normal.   Skin: Skin is warm and dry. No rash noted.  Psychiatric: She has a normal mood and affect.    ED Course  Procedures (including critical care time) Labs Review Labs Reviewed  BASIC METABOLIC PANEL - Abnormal; Notable for the following:    Glucose, Bld 147 (*)    GFR calc non Af Amer 83 (*)    All other components within normal limits  CBC WITH DIFFERENTIAL  TROPONIN I   Imaging Review Dg Chest Portable 1 View  10/12/2013   CLINICAL DATA:  Dizziness, cough, shortness of breath.  EXAM: PORTABLE CHEST - 1 VIEW  COMPARISON:  11/12/2011  FINDINGS: Slight peribronchial thickening and hyperinflation. Heart is normal size. Lungs are clear. No effusions or acute bony abnormality.  IMPRESSION: Mild bronchitic changes.   Electronically Signed   By: Charlett Nose M.D.   On: 10/12/2013 18:29    EKG Interpretation   None       MDM  No diagnosis found. 1. Vertigo  She feels much better with Meclizine. Lab studies, CXR, EKG reassuring. No chest pain while in ED. Position changes with less dizziness. She feels stable for discharge home.     Arnoldo Hooker, PA-C 10/12/13 1940

## 2013-10-12 NOTE — ED Notes (Signed)
Pt states able to sit up at present,  Dizziness is not as bad

## 2013-10-12 NOTE — ED Notes (Signed)
Pt c/o dizziness SOb and weakness x 3 days

## 2013-11-14 DIAGNOSIS — D219 Benign neoplasm of connective and other soft tissue, unspecified: Secondary | ICD-10-CM

## 2013-11-14 HISTORY — DX: Benign neoplasm of connective and other soft tissue, unspecified: D21.9

## 2013-12-31 ENCOUNTER — Ambulatory Visit: Payer: BC Managed Care – PPO | Admitting: Gynecology

## 2014-01-05 ENCOUNTER — Encounter: Payer: Self-pay | Admitting: Gynecology

## 2014-01-07 ENCOUNTER — Ambulatory Visit (INDEPENDENT_AMBULATORY_CARE_PROVIDER_SITE_OTHER): Payer: BC Managed Care – PPO | Admitting: Gynecology

## 2014-01-07 ENCOUNTER — Encounter: Payer: Self-pay | Admitting: Gynecology

## 2014-01-07 VITALS — BP 122/70 | HR 62 | Resp 14 | Ht 65.0 in | Wt 207.0 lb

## 2014-01-07 DIAGNOSIS — Z0000 Encounter for general adult medical examination without abnormal findings: Secondary | ICD-10-CM

## 2014-01-07 DIAGNOSIS — Z01419 Encounter for gynecological examination (general) (routine) without abnormal findings: Secondary | ICD-10-CM

## 2014-01-07 DIAGNOSIS — Z713 Dietary counseling and surveillance: Secondary | ICD-10-CM

## 2014-01-07 LAB — HEMOGLOBIN, FINGERSTICK: Hemoglobin, fingerstick: 13.7 g/dL (ref 12.0–16.0)

## 2014-01-07 LAB — POCT URINALYSIS DIPSTICK
LEUKOCYTES UA: NEGATIVE
Urobilinogen, UA: NEGATIVE
pH, UA: 5

## 2014-01-07 NOTE — Patient Instructions (Signed)
Exercise to Lose Weight Exercise and a healthy diet may help you lose weight. Your doctor may suggest specific exercises. EXERCISE IDEAS AND TIPS  Choose low-cost things you enjoy doing, such as walking, bicycling, or exercising to workout videos.  Take stairs instead of the elevator.  Walk during your lunch break.  Park your car further away from work or school.  Go to a gym or an exercise class.  Start with 5 to 10 minutes of exercise each day. Build up to 30 minutes of exercise 4 to 6 days a week.  Wear shoes with good support and comfortable clothes.  Stretch before and after working out.  Work out until you breathe harder and your heart beats faster.  Drink extra water when you exercise.  Do not do so much that you hurt yourself, feel dizzy, or get very short of breath. Exercises that burn about 150 calories:  Running 1  miles in 15 minutes.  Playing volleyball for 45 to 60 minutes.  Washing and waxing a car for 45 to 60 minutes.  Playing touch football for 45 minutes.  Walking 1  miles in 35 minutes.  Pushing a stroller 1  miles in 30 minutes.  Playing basketball for 30 minutes.  Raking leaves for 30 minutes.  Bicycling 5 miles in 30 minutes.  Walking 2 miles in 30 minutes.  Dancing for 30 minutes.  Shoveling snow for 15 minutes.  Swimming laps for 20 minutes.  Walking up stairs for 15 minutes.  Bicycling 4 miles in 15 minutes.  Gardening for 30 to 45 minutes.  Jumping rope for 15 minutes.  Washing windows or floors for 45 to 60 minutes. Document Released: 11/02/2010 Document Revised: 12/23/2011 Document Reviewed: 11/02/2010 Exit[employer] Patient Information 2014 [employer]. Calorie Counting Diet A calorie counting diet requires you to eat the number of calories that are right for you in a day. Calories are the measurement of how much energy you get from the food you eat. Eating the right amount of calories is important for staying at a  healthy weight. If you eat too many calories, your body will store them as fat and you may gain weight. If you eat too few calories, you may lose weight. Counting the number of calories you eat during a day will help you know if you are eating the right amount. A Registered Dietitian can determine how many calories you need in a day. The amount of calories needed varies from person to person. If your goal is to lose weight, you will need to eat fewer calories. Losing weight can benefit you if you are overweight or have health problems such as heart disease, high blood pressure, or diabetes. If your goal is to gain weight, you will need to eat more calories. Gaining weight may be necessary if you have a certain health problem that causes your body to need more energy. TIPS Whether you are increasing or decreasing the number of calories you eat during a day, it may be hard to get used to changes in what you eat and drink. The following are tips to help you keep track of the number of calories you eat.  Measure foods at home with measuring cups. This helps you know the amount of food and number of calories you are eating.  Restaurants often serve food in amounts that are larger than 1 serving. While eating out, estimate how many servings of a food you are given. For example, a serving of cooked rice   is  cup or about the size of half of a fist. Knowing serving sizes will help you be aware of how much food you are eating at restaurants.  Ask for smaller portion sizes or child-size portions at restaurants.  Plan to eat half of a meal at a restaurant. Take the rest home or share the other half with a friend.  Read the Nutrition Facts panel on food labels for calorie content and serving size. You can find out how many servings are in a package, the size of a serving, and the number of calories each serving has.  For example, a package might contain 3 cookies. The Nutrition Facts panel on that package says  that 1 serving is 1 cookie. Below that, it will say there are 3 servings in the container. The calories section of the Nutrition Facts label says there are 90 calories. This means there are 90 calories in 1 cookie (1 serving). If you eat 1 cookie you have eaten 90 calories. If you eat all 3 cookies, you have eaten 270 calories (3 servings x 90 calories = 270 calories). The list below tells you how big or small some common portion sizes are.  1 oz.........4 stacked dice.  3 oz.........Deck of cards.  1 tsp........Tip of little finger.  1 tbs........Thumb.  2 tbs........Golf ball.   cup.......Half of a fist.  1 cup........A fist. KEEP A FOOD LOG Write down every food item you eat, the amount you eat, and the number of calories in each food you eat during the day. At the end of the day, you can add up the total number of calories you have eaten. It may help to keep a list like the one below. Find out the calorie information by reading the Nutrition Facts panel on food labels. Breakfast  Bran cereal (1 cup, 110 calories).  Fat-free milk ( cup, 45 calories). Snack  Apple (1 medium, 80 calories). Lunch  Spinach (1 cup, 20 calories).  Tomato ( medium, 20 calories).  Chicken breast strips (3 oz, 165 calories).  Shredded cheddar cheese ( cup, 110 calories).  Light Italian dressing (2 tbs, 60 calories).  Whole-wheat bread (1 slice, 80 calories).  Tub margarine (1 tsp, 35 calories).  Vegetable soup (1 cup, 160 calories). Dinner  Pork chop (3 oz, 190 calories).  Brown rice (1 cup, 215 calories).  Steamed broccoli ( cup, 20 calories).  Strawberries (1  cup, 65 calories).  Whipped cream (1 tbs, 50 calories). Daily Calorie Total: 1425 Document Released: 09/30/2005 Document Revised: 12/23/2011 Document Reviewed: 03/27/2007 Exit[employer] Patient Information 2014 [employer].  

## 2014-01-07 NOTE — Progress Notes (Signed)
53 y.o. Married Caucasian female   G2P2002 here for annual exam. Pt reports menses are absent due to Menopause  She does report hot flashes, does have night sweats, does have vaginal dryness.  She is not using lubricants.  She does not report post-menopasual bleeding.  Pt interested in weight loss suggestions.  Pt walks dog once a day,64m, not vigorously.  Work offers conditioning class but could not participate due to vertigo-on antivert prn.  No LMP recorded. Patient is postmenopausal.          Sexually active: yes  The current method of family planning is post menopausal status.    Exercising: yes  walks dogs daily  Last pap: 11/26/11 neg hr hpv Abnormal PAP: no Mammogram: 04/25/12 Bi-Rads 1 BSE: yes  Colonoscopy: 05/14/13 Normal - Polyps DEXA: none Alcohol: no Tobacco: no  Hgb: 13.7 ; Urine: Negative   Health Maintenance  Topic Date Due  . Influenza Vaccine  05/14/2013  . Mammogram  04/25/2014  . Pap Smear  11/25/2014  . Tetanus/tdap  05/14/2017  . Colonoscopy  05/14/2018    Family History  Problem Relation Age of Onset  . Heart disease Mother   . Stroke Mother   . Thyroid disease Mother   . Heart disease Father   . Heart disease Sister   . Heart disease Brother   . Thyroid disease Brother   . Cancer Paternal Grandmother     lung cancer  . Cancer Paternal Grandfather     prostate cancer  . Prostate cancer Paternal Uncle   . Colon cancer Neg Hx   . Nephrolithiasis Daughter   . Breast cancer Maternal Aunt     x2  . Hypertension Mother   . Hypertension Father   . Stroke Mother   . Emphysema Father     Patient Active Problem List   Diagnosis Date Noted  . Depression 11/12/2011  . Hypertension 11/12/2011  . Hormone replacement therapy (postmenopausal) 11/12/2011  . Anxiety 11/12/2011    Past Medical History  Diagnosis Date  . Anxiety   . Hypertension   . Depression   . Fibroid 11/2013    fundal - 2 1/2 cm sac mucosal    Past Surgical History  Procedure  Laterality Date  . Eye surgery      Lasik  . Tubal ligation  1997  . Knee arthroscopy Left 1993  . Arm wound repair / closure      left    Allergies: Review of patient's allergies indicates no known allergies.  Current Outpatient Prescriptions  Medication Sig Dispense Refill  . ALPRAZolam (XANAX) 0.5 MG tablet Take 1 tablet (0.5 mg total) by mouth daily as needed for anxiety.  30 tablet  0  . buPROPion (WELLBUTRIN XL) 150 MG 24 hr tablet Take 1 tablet (150 mg total) by mouth daily.  90 tablet  3  . escitalopram (LEXAPRO) 20 MG tablet Take 1 tablet (20 mg total) by mouth daily.  90 tablet  3  . lisinopril (PRINIVIL,ZESTRIL) 10 MG tablet Take 10 mg by mouth daily.      . meclizine (ANTIVERT) 25 MG tablet Take 1 tablet (25 mg total) by mouth 3 (three) times daily as needed for dizziness.  21 tablet  0   No current facility-administered medications for this visit.    ROS: Pertinent items are noted in HPI.  Exam:    Ht 5\' 5"  (1.651 m)  Wt 207 lb (93.895 kg)  BMI 34.45 kg/m2 Weight change: @WEIGHTCHANGE @ Last  3 height recordings:  Ht Readings from Last 3 Encounters:  01/07/14 5\' 5"  (1.651 m)  10/12/13 5\' 4"  (1.626 m)  08/03/13 5\' 3"  (1.6 m)   General appearance: alert, cooperative and appears stated age Head: Normocephalic, without obvious abnormality, atraumatic Neck: no adenopathy, no carotid bruit, no JVD, supple, symmetrical, trachea midline and thyroid not enlarged, symmetric, no tenderness/mass/nodules Lungs: clear to auscultation bilaterally Breasts: normal appearance, no masses or tenderness Heart: regular rate and rhythm, S1, S2 normal, no murmur, click, rub or gallop Abdomen: soft, non-tender; bowel sounds normal; no masses,  no organomegaly Extremities: extremities normal, atraumatic, no cyanosis or edema Skin: Skin color, texture, turgor normal. No rashes or lesions Lymph nodes: Cervical, supraclavicular, and axillary nodes normal. no inguinal nodes  palpated Neurologic: Grossly normal   Pelvic: External genitalia:  no lesions              Urethra: normal appearing urethra with no masses, tenderness or lesions              Bartholins and Skenes: Bartholin's, Urethra, Skene's normal                 Vagina: normal appearing vagina with normal color and discharge, no lesions              Cervix: normal appearance              Pap taken: no        Bimanual Exam:  Uterus:  uterus is normal size, shape, consistency and nontender, enlarged to 10 week's size, deviated to right                                      Adnexa:    no masses                                      Rectovaginal: Confirms                                      Anus:  normal sphincter tone, no lesions  A: well woman obesity     P: mammogram counseled on breast self exam, mammography screening, adequate intake of calcium and vitamin D, diet and exercise reviewed the recommendations for exercise and diet, handouts given, suggest she try exercise classes when not having vertigo Discussed slow increase in activity. Importance of protein and limited processed foods. return annually or prn Discussed PAP guideline changes, importance of weight bearing exercises, calcium, vit D and balanced diet.  An After Visit Summary was printed and given to the patient.   107m spent discussing diet and exercise, >50% face to face

## 2014-02-03 ENCOUNTER — Ambulatory Visit (INDEPENDENT_AMBULATORY_CARE_PROVIDER_SITE_OTHER): Payer: BC Managed Care – PPO | Admitting: Physician Assistant

## 2014-02-03 ENCOUNTER — Encounter: Payer: Self-pay | Admitting: Physician Assistant

## 2014-02-03 VITALS — BP 151/87 | HR 74 | Temp 98.4°F | Resp 16 | Ht 64.5 in | Wt 207.0 lb

## 2014-02-03 DIAGNOSIS — M722 Plantar fascial fibromatosis: Secondary | ICD-10-CM

## 2014-02-03 DIAGNOSIS — F3289 Other specified depressive episodes: Secondary | ICD-10-CM

## 2014-02-03 DIAGNOSIS — F411 Generalized anxiety disorder: Secondary | ICD-10-CM

## 2014-02-03 DIAGNOSIS — F32A Depression, unspecified: Secondary | ICD-10-CM

## 2014-02-03 DIAGNOSIS — F419 Anxiety disorder, unspecified: Secondary | ICD-10-CM

## 2014-02-03 DIAGNOSIS — I10 Essential (primary) hypertension: Secondary | ICD-10-CM

## 2014-02-03 DIAGNOSIS — F329 Major depressive disorder, single episode, unspecified: Secondary | ICD-10-CM

## 2014-02-03 LAB — COMPREHENSIVE METABOLIC PANEL
ALBUMIN: 4.2 g/dL (ref 3.5–5.2)
ALK PHOS: 101 U/L (ref 39–117)
ALT: 20 U/L (ref 0–35)
AST: 13 U/L (ref 0–37)
BUN: 18 mg/dL (ref 6–23)
CO2: 26 mEq/L (ref 19–32)
Calcium: 9.5 mg/dL (ref 8.4–10.5)
Chloride: 106 mEq/L (ref 96–112)
Creat: 0.65 mg/dL (ref 0.50–1.10)
Glucose, Bld: 105 mg/dL — ABNORMAL HIGH (ref 70–99)
POTASSIUM: 3.7 meq/L (ref 3.5–5.3)
SODIUM: 140 meq/L (ref 135–145)
TOTAL PROTEIN: 6.9 g/dL (ref 6.0–8.3)
Total Bilirubin: 0.4 mg/dL (ref 0.2–1.2)

## 2014-02-03 LAB — LIPID PANEL
Cholesterol: 164 mg/dL (ref 0–200)
HDL: 42 mg/dL (ref >39)
LDL CALC: 95 mg/dL (ref 0–99)
Total CHOL/HDL Ratio: 3.9 Ratio
Triglycerides: 134 mg/dL (ref <150)
VLDL: 27 mg/dL (ref 0–40)

## 2014-02-03 LAB — TSH: TSH: 2.216 u[IU]/mL (ref 0.350–4.500)

## 2014-02-03 MED ORDER — ESCITALOPRAM OXALATE 20 MG PO TABS: 20.0000 mg | ORAL_TABLET | Freq: Every day | ORAL | Status: DC

## 2014-02-03 MED ORDER — BUPROPION HCL ER (XL) 150 MG PO TB24: 150.0000 mg | ORAL_TABLET | Freq: Every day | ORAL | Status: DC

## 2014-02-03 NOTE — Patient Instructions (Signed)
I will contact you with your lab results as soon as they are available.   If you have not heard from me in 2 weeks, please contact me.  The fastest way to get your results is to register for My Chart (see the instructions on the last page of this printout).  Plantar Fasciitis (Heel Spur Syndrome) with Rehab The plantar fascia is a fibrous, ligament-like, soft-tissue structure that spans the bottom of the foot. Plantar fasciitis is a condition that causes pain in the foot due to inflammation of the tissue. SYMPTOMS   Pain and tenderness on the underneath side of the foot.  Pain that worsens with standing or walking. CAUSES  Plantar fasciitis is caused by irritation and injury to the plantar fascia on the underneath side of the foot. Common mechanisms of injury include:  Direct trauma to bottom of the foot.  Damage to a small nerve that runs under the foot where the main fascia attaches to the heel bone.  Stress placed on the plantar fascia due to bone spurs. RISK INCREASES WITH:   Activities that place stress on the plantar fascia (running, jumping, pivoting, or cutting).  Poor strength and flexibility.  Improperly fitted shoes.  Tight calf muscles.  Flat feet.  Failure to warm-up properly before activity.  Obesity. PREVENTION  Warm up and stretch properly before activity.  Allow for adequate recovery between workouts.  Maintain physical fitness:  Strength, flexibility, and endurance.  Cardiovascular fitness.  Maintain a health body weight.  Avoid stress on the plantar fascia.  Wear properly fitted shoes, including arch supports for individuals who have flat feet. PROGNOSIS  If treated properly, then the symptoms of plantar fasciitis usually resolve without surgery. However, occasionally surgery is necessary. RELATED COMPLICATIONS   Recurrent symptoms that may result in a chronic condition.  Problems of the lower back that are caused by compensating for the  injury, such as limping.  Pain or weakness of the foot during push-off following surgery.  Chronic inflammation, scarring, and partial or complete fascia tear, occurring more often from repeated injections. TREATMENT  Treatment initially involves the use of ice and medication to help reduce pain and inflammation. The use of strengthening and stretching exercises may help reduce pain with activity, especially stretches of the Achilles tendon. These exercises may be performed at home or with a therapist. Your caregiver may recommend that you use heel cups of arch supports to help reduce stress on the plantar fascia. Occasionally, corticosteroid injections are given to reduce inflammation. If symptoms persist for greater than 6 months despite non-surgical (conservative), then surgery may be recommended.  MEDICATION   If pain medication is necessary, then nonsteroidal anti-inflammatory medications, such as aspirin and ibuprofen, or other minor pain relievers, such as acetaminophen, are often recommended.  Do not take pain medication within 7 days before surgery.  Prescription pain relievers may be given if deemed necessary by your caregiver. Use only as directed and only as much as you need.  Corticosteroid injections may be given by your caregiver. These injections should be reserved for the most serious cases, because they may only be given a certain number of times. HEAT AND COLD  Cold treatment (icing) relieves pain and reduces inflammation. Cold treatment should be applied for 10 to 15 minutes every 2 to 3 hours for inflammation and pain and immediately after any activity that aggravates your symptoms. Use ice packs or massage the area with a piece of ice (ice massage).  Heat treatment may be  used prior to performing the stretching and strengthening activities prescribed by your caregiver, physical therapist, or athletic trainer. Use a heat pack or soak the injury in warm water. SEEK IMMEDIATE  MEDICAL CARE IF:  Treatment seems to offer no benefit, or the condition worsens.  Any medications produce adverse side effects. EXERCISES RANGE OF MOTION (ROM) AND STRETCHING EXERCISES - Plantar Fasciitis (Heel Spur Syndrome) These exercises may help you when beginning to rehabilitate your injury. Your symptoms may resolve with or without further involvement from your physician, physical therapist or athletic trainer. While completing these exercises, remember:   Restoring tissue flexibility helps normal motion to return to the joints. This allows healthier, less painful movement and activity.  An effective stretch should be held for at least 30 seconds.  A stretch should never be painful. You should only feel a gentle lengthening or release in the stretched tissue. RANGE OF MOTION - Toe Extension, Flexion  Sit with your right / left leg crossed over your opposite knee.  Grasp your toes and gently pull them back toward the top of your foot. You should feel a stretch on the bottom of your toes and/or foot.  Hold this stretch for ____10______ seconds.  Now, gently pull your toes toward the bottom of your foot. You should feel a stretch on the top of your toes and or foot.  Hold this stretch for ____10______ seconds. Repeat ____5-10______ times. Complete this stretch ____1-2______ times per day.  RANGE OF MOTION - Ankle Dorsiflexion, Active Assisted  Remove shoes and sit on a chair that is preferably not on a carpeted surface.  Place right / left foot under knee. Extend your opposite leg for support.  Keeping your heel down, slide your right / left foot back toward the chair until you feel a stretch at your ankle or calf. If you do not feel a stretch, slide your bottom forward to the edge of the chair, while still keeping your heel down.  Hold this stretch for _____10_____ seconds. Repeat ____5-10______ times. Complete this stretch ____1-2______ times per day.  STRETCH  Gastroc,  Standing  Place hands on wall.  Extend right / left leg, keeping the front knee somewhat bent.  Slightly point your toes inward on your back foot.  Keeping your right / left heel on the floor and your knee straight, shift your weight toward the wall, not allowing your back to arch.  You should feel a gentle stretch in the right / left calf. Hold this position for ____10______ seconds. Repeat ____5-10______ times. Complete this stretch ____1-2______ times per day. STRETCH  Soleus, Standing  Place hands on wall.  Extend right / left leg, keeping the other knee somewhat bent.  Slightly point your toes inward on your back foot.  Keep your right / left heel on the floor, bend your back knee, and slightly shift your weight over the back leg so that you feel a gentle stretch deep in your back calf.  Hold this position for _____10_____ seconds. Repeat ____5-10______ times. Complete this stretch _____1-2_____ times per day. STRETCH  Gastrocsoleus, Standing  Note: This exercise can place a lot of stress on your foot and ankle. Please complete this exercise only if specifically instructed by your caregiver.   Place the ball of your right / left foot on a step, keeping your other foot firmly on the same step.  Hold on to the wall or a rail for balance.  Slowly lift your other foot, allowing your body weight  to press your heel down over the edge of the step.  You should feel a stretch in your right / left calf.  Hold this position for _____10_____ seconds.  Repeat this exercise with a slight bend in your right / left knee. Repeat ____5-10______ times. Complete this stretch _____1-2_____ times per day.  STRENGTHENING EXERCISES - Plantar Fasciitis (Heel Spur Syndrome)  These exercises may help you when beginning to rehabilitate your injury. They may resolve your symptoms with or without further involvement from your physician, physical therapist or athletic trainer. While completing these  exercises, remember:   Muscles can gain both the endurance and the strength needed for everyday activities through controlled exercises.  Complete these exercises as instructed by your physician, physical therapist or athletic trainer. Progress the resistance and repetitions only as guided. STRENGTH - Towel Curls  Sit in a chair positioned on a non-carpeted surface.  Place your foot on a towel, keeping your heel on the floor.  Pull the towel toward your heel by only curling your toes. Keep your heel on the floor.  If instructed by your physician, physical therapist or athletic trainer, add ____________________ at the end of the towel. Repeat ____5-10______ times. Complete this exercise ____1-2______ times per day. STRENGTH - Ankle Inversion  Secure one end of a rubber exercise band/tubing to a fixed object (table, pole). Loop the other end around your foot just before your toes.  Place your fists between your knees. This will focus your strengthening at your ankle.  Slowly, pull your big toe up and in, making sure the band/tubing is positioned to resist the entire motion.  Hold this position for _____10_____ seconds.  Have your muscles resist the band/tubing as it slowly pulls your foot back to the starting position. Repeat ____5-10______ times. Complete this exercises _____1-2_____ times per day.  Document Released: 09/30/2005 Document Revised: 12/23/2011 Document Reviewed: 01/12/2009 [facility] Patient Information 2014 [place], [place].

## 2014-02-04 NOTE — Progress Notes (Signed)
   Subjective:    Patient ID: Jillian Steele, female    DOB: [birth year], 53 y.o.   MRN: [id number]   PCP: Dashan Chizmar, PA-C  Chief Complaint  Patient presents with  . Medication Refill    Medications, allergies, past medical history, surgical history, family history, social history and problem list reviewed and updated.  HPI  Presents for medication refill.  Feels pretty good, overall.   Has LEFT heel pain.  Worst with getting up after sitting for a while.  Not particularly bad in the morning when she first gets up. Previously told she has plantar fasciitis, with a small calcaneal spur seen on xray.  Review of Systems No chest pain, SOB, HA, dizziness, vision change, N/V, diarrhea, constipation, dysuria, urinary urgency or frequency, other myalgias, arthralgias or rash.     Objective:   Physical Exam  Blood pressure 151/87, pulse 74, temperature 98.4 F (36.9 C), temperature source Oral, resp. rate 16, height 5' 4.5" (1.638 m), weight 207 lb (93.895 kg), last menstrual period 10/14/2008, SpO2 97.00%. Body mass index is 35 kg/(m^2). Well-developed, well nourished WF who is awake, alert and oriented, in NAD. HEENT: [place]/AT, sclera and conjunctiva are clear.  OP is clear. Neck: supple, non-tender, no lymphadenopathy, thyromegaly. Heart: RRR, no murmur Lungs: normal effort, CTA Extremities: no cyanosis, clubbing or edema. Skin: warm and dry without rash. Psychologic: good mood and appropriate affect, normal speech and behavior.       Assessment & Plan:  1. Hypertension Above goal today.  Not taking lisinopril, but BP has been controlled outside of this office, including GYN last month (122/70).  We elect to remain OFF the lisinopril for now, and she'll monitor her BP.  Let me know if it rises above 140/90 consistently, and we'll restart it. - Comprehensive metabolic panel - Lipid panel - TSH  2. Depression 3. Anxiety Stable.  Continue current treatment. - buPROPion  (WELLBUTRIN XL) 150 MG 24 hr tablet; Take 1 tablet (150 mg total) by mouth daily.  Dispense: 90 tablet; Refill: 3 - escitalopram (LEXAPRO) 20 MG tablet; Take 1 tablet (20 mg total) by mouth daily.  Dispense: 90 tablet; Refill: 3  4. Plantar fasciitis NSAIDS. Ice massage, stretching.  If symptoms persist, refer to Sports Medicine.   Fara Chute, PA-C Physician Assistant-Certified [facility]

## 2014-08-01 ENCOUNTER — Ambulatory Visit (INDEPENDENT_AMBULATORY_CARE_PROVIDER_SITE_OTHER): Payer: BC Managed Care – PPO | Admitting: Physician Assistant

## 2014-08-01 VITALS — BP 142/92 | HR 74 | Temp 98.7°F | Resp 16 | Ht 65.0 in | Wt 208.0 lb

## 2014-08-01 DIAGNOSIS — R059 Cough, unspecified: Secondary | ICD-10-CM

## 2014-08-01 DIAGNOSIS — J0100 Acute maxillary sinusitis, unspecified: Secondary | ICD-10-CM

## 2014-08-01 DIAGNOSIS — R05 Cough: Secondary | ICD-10-CM

## 2014-08-01 MED ORDER — AMOXICILLIN-POT CLAVULANATE 875-125 MG PO TABS: 1.0000 | ORAL_TABLET | Freq: Two times a day (BID) | ORAL | Status: DC

## 2014-08-01 MED ORDER — HYDROCOD POLST-CHLORPHEN POLST 10-8 MG/5ML PO LQCR: 5.0000 mL | Freq: Two times a day (BID) | ORAL | Status: DC | PRN

## 2014-08-01 MED ORDER — IPRATROPIUM BROMIDE 0.03 % NA SOLN: 2.0000 | Freq: Two times a day (BID) | NASAL | Status: DC

## 2014-08-01 NOTE — Progress Notes (Signed)
Subjective:    Patient ID: Jillian Steele, female    DOB: 05/23/1961, 53 y.o.   MRN: [id number]   PCP: Maui Britten, PA-C  Chief Complaint  Patient presents with  . Nasal Congestion    x 5 days  . Cough  . Sore Throat    Medications, allergies, past medical history, surgical history, family history, social history and problem list reviewed and updated.  Patient Active Problem List   Diagnosis Date Noted  . Depression 11/12/2011  . Hypertension 11/12/2011  . Anxiety 11/12/2011    Prior to Admission medications   Medication Sig Start Date End Date Taking? Authorizing Provider  ALPRAZolam Duanne Moron) 0.5 MG tablet Take 1 tablet (0.5 mg total) by mouth daily as needed for anxiety. 08/03/13  Yes Torie Towle S Katyra Tomassetti, PA-C  escitalopram (LEXAPRO) 20 MG tablet Take 1 tablet (20 mg total) by mouth daily. 02/03/14  Yes Diyana Starrett S Sumeya Yontz, PA-C    HPI  Ear pressure and fullness last week.  4-5 days ago developed congestion and pressure. Today she developed cough, tightness in the chest, SOB, facial and dental pain. Mucinex D without benefit. Hot flashes. No fever/chills. No GI symptoms.  Review of Systems As above.    Objective:   Physical Exam  Vitals reviewed. Constitutional: She is oriented to person, place, and time. Vital signs are normal. She appears well-developed and well-nourished. No distress.  BP 142/92  Pulse 74  Temp(Src) 98.7 F (37.1 C)  Resp 16  Ht 5\' 5"  (1.651 m)  Wt 208 lb (94.348 kg)  BMI 34.61 kg/m2  SpO2 97%  LMP 10/14/2008   HENT:  Head: Normocephalic and atraumatic.  Right Ear: Hearing, tympanic membrane, external ear and ear canal normal.  Left Ear: Hearing, tympanic membrane, external ear and ear canal normal.  Nose: Mucosal edema and rhinorrhea present.  No foreign bodies. Right sinus exhibits maxillary sinus tenderness and frontal sinus tenderness. Left sinus exhibits maxillary sinus tenderness and frontal sinus tenderness.  Mouth/Throat: Uvula is  midline, oropharynx is clear and moist and mucous membranes are normal. No uvula swelling. No oropharyngeal exudate.  Eyes: Conjunctivae and EOM are normal. Pupils are equal, round, and reactive to light. Right eye exhibits no discharge. Left eye exhibits no discharge. No scleral icterus.  Neck: Trachea normal, normal range of motion and full passive range of motion without pain. Neck supple. No mass and no thyromegaly present.  Cardiovascular: Normal rate, regular rhythm and normal heart sounds.   Pulmonary/Chest: Effort normal and breath sounds normal.  Lymphadenopathy:       Head (right side): No submandibular, no tonsillar, no preauricular, no posterior auricular and no occipital adenopathy present.       Head (left side): No submandibular, no tonsillar, no preauricular and no occipital adenopathy present.    She has no cervical adenopathy.       Right: No supraclavicular adenopathy present.       Left: No supraclavicular adenopathy present.  Neurological: She is alert and oriented to person, place, and time. She has normal strength. No cranial nerve deficit or sensory deficit.  Skin: Skin is warm, dry and intact. No rash noted.  Psychiatric: She has a normal mood and affect. Her speech is normal and behavior is normal.          Assessment & Plan:  1. Subacute maxillary sinusitis Supportive care. OTC Mucinex.  - amoxicillin-clavulanate (AUGMENTIN) 875-125 MG per tablet; Take 1 tablet by mouth 2 (two) times daily.  Dispense: 20 tablet; Refill:  0 - ipratropium (ATROVENT) 0.03 % nasal spray; Place 2 sprays into both nostrils 2 (two) times daily.  Dispense: 30 mL; Refill: 0  2. Cough - chlorpheniramine-HYDROcodone (TUSSIONEX PENNKINETIC ER) 10-8 MG/5ML LQCR; Take 5 mLs by mouth every 12 (twelve) hours as needed for cough (cough).  Dispense: 100 mL; Refill: 0   Fara Chute, PA-C Physician Assistant-Certified [facility]

## 2014-08-01 NOTE — Patient Instructions (Signed)
Get plenty of rest and drink at least 64 ounces of water daily. 

## 2014-08-11 ENCOUNTER — Ambulatory Visit: Payer: Self-pay | Admitting: Physician Assistant

## 2014-09-13 ENCOUNTER — Telehealth: Payer: Self-pay

## 2014-09-13 NOTE — Telephone Encounter (Signed)
Pt VM stating it had not been set up yet.  Will try again later

## 2014-09-13 NOTE — Telephone Encounter (Signed)
Pt scheduled 01/16/15 with Patty.

## 2014-12-20 ENCOUNTER — Emergency Department (HOSPITAL_BASED_OUTPATIENT_CLINIC_OR_DEPARTMENT_OTHER): Payer: 59

## 2014-12-20 ENCOUNTER — Encounter (HOSPITAL_BASED_OUTPATIENT_CLINIC_OR_DEPARTMENT_OTHER): Payer: Self-pay | Admitting: Emergency Medicine

## 2014-12-20 ENCOUNTER — Emergency Department (HOSPITAL_BASED_OUTPATIENT_CLINIC_OR_DEPARTMENT_OTHER)
Admission: EM | Admit: 2014-12-20 | Discharge: 2014-12-20 | Disposition: A | Discharge status: Home / Self Care | Payer: 59 | Attending: Emergency Medicine | Admitting: Emergency Medicine

## 2014-12-20 DIAGNOSIS — Z792 Long term (current) use of antibiotics: Secondary | ICD-10-CM | POA: Diagnosis not present

## 2014-12-20 DIAGNOSIS — Z79899 Other long term (current) drug therapy: Secondary | ICD-10-CM | POA: Insufficient documentation

## 2014-12-20 DIAGNOSIS — R51 Headache: Secondary | ICD-10-CM | POA: Insufficient documentation

## 2014-12-20 DIAGNOSIS — I10 Essential (primary) hypertension: Secondary | ICD-10-CM | POA: Diagnosis not present

## 2014-12-20 DIAGNOSIS — F329 Major depressive disorder, single episode, unspecified: Secondary | ICD-10-CM | POA: Diagnosis not present

## 2014-12-20 DIAGNOSIS — R519 Headache, unspecified: Secondary | ICD-10-CM

## 2014-12-20 DIAGNOSIS — F419 Anxiety disorder, unspecified: Secondary | ICD-10-CM | POA: Diagnosis not present

## 2014-12-20 DIAGNOSIS — Z8742 Personal history of other diseases of the female genital tract: Secondary | ICD-10-CM | POA: Insufficient documentation

## 2014-12-20 LAB — BASIC METABOLIC PANEL
ANION GAP: 0 — AB (ref 5–15)
BUN: 15 mg/dL (ref 6–23)
CALCIUM: 8.8 mg/dL (ref 8.4–10.5)
CHLORIDE: 108 mmol/L (ref 96–112)
CO2: 28 mmol/L (ref 19–32)
Creatinine, Ser: 0.7 mg/dL (ref 0.50–1.10)
GFR calc Af Amer: >90 mL/min (ref >90)
GFR calc non Af Amer: >90 mL/min (ref >90)
GLUCOSE: 113 mg/dL — AB (ref 70–99)
Potassium: 3.4 mmol/L — ABNORMAL LOW (ref 3.5–5.1)
Sodium: 136 mmol/L (ref 135–145)

## 2014-12-20 LAB — CBC WITH DIFFERENTIAL/PLATELET
Basophils Absolute: 0 10*3/uL (ref 0.0–0.1)
Basophils Relative: 0 % (ref 0–1)
EOS PCT: 2 % (ref 0–5)
Eosinophils Absolute: 0.2 10*3/uL (ref 0.0–0.7)
HEMATOCRIT: 37.8 % (ref 36.0–46.0)
Hemoglobin: 12.8 g/dL (ref 12.0–15.0)
LYMPHS ABS: 3.1 10*3/uL (ref 0.7–4.0)
Lymphocytes Relative: 32 % (ref 12–46)
MCH: 28.6 pg (ref 26.0–34.0)
MCHC: 33.9 g/dL (ref 30.0–36.0)
MCV: 84.6 fL (ref 78.0–100.0)
Monocytes Absolute: 0.6 10*3/uL (ref 0.1–1.0)
Monocytes Relative: 6 % (ref 3–12)
NEUTROS PCT: 60 % (ref 43–77)
Neutro Abs: 5.6 10*3/uL (ref 1.7–7.7)
Platelets: 223 10*3/uL (ref 150–400)
RBC: 4.47 MIL/uL (ref 3.87–5.11)
RDW: 14 % (ref 11.5–15.5)
WBC: 9.5 10*3/uL (ref 4.0–10.5)

## 2014-12-20 MED ORDER — METOCLOPRAMIDE HCL 5 MG/ML IJ SOLN
10.0000 mg | Freq: Once | INTRAMUSCULAR | Status: AC
  Administered 2014-12-20: 10 mg via INTRAVENOUS
  Filled 2014-12-20: qty 2

## 2014-12-20 MED ORDER — SODIUM CHLORIDE 0.9 % IV BOLUS (SEPSIS)
500.0000 mL | Freq: Once | INTRAVENOUS | Status: AC
  Administered 2014-12-20: 500 mL via INTRAVENOUS

## 2014-12-20 MED ORDER — DIPHENHYDRAMINE HCL 50 MG/ML IJ SOLN
25.0000 mg | Freq: Once | INTRAMUSCULAR | Status: AC
  Administered 2014-12-20: 25 mg via INTRAVENOUS
  Filled 2014-12-20: qty 1

## 2014-12-20 NOTE — ED Notes (Signed)
Pt states she started having a HA today.  Her doctor had taken her off her BP meds and told her to keep a check on it.

## 2014-12-20 NOTE — ED Provider Notes (Signed)
CSN: [id number]     Arrival date & time 12/20/14  1946 History   First MD Initiated Contact with Patient 12/20/14 2040     Chief Complaint  Patient presents with  . Hypertension  . Headache     (Consider location/radiation/quality/duration/timing/severity/associated sxs/prior Treatment) HPI  Oil thing a present with a gradually worsening headache that started around 4 PM today. Patient states that his been gradually worsening and now is more localized on the right side of her head. No neck pain or stiffness. No blurry vision but does have photophobia. Patient is not had any fevers or chills. Is not had any weakness or numbness. Does not typically get headaches. He is to be on blood pressure medicine but has been off of her lisinopril for about one year because her blood pressure was well controlled. Last checked her blood pressure couple weeks ago and it was in the 130s over 80s. Tonight it was 170/100. Patient denies a chest pain or shortness of breath.  Past Medical History  Diagnosis Date  . Anxiety   . Hypertension   . Depression   . Fibroid 11/2013    fundal - 2 1/2 cm sac mucosal  . Hormone replacement therapy (postmenopausal) 11/12/2011   Past Surgical History  Procedure Laterality Date  . Eye surgery      Lasik  . Tubal ligation  1997  . Knee arthroscopy Left 1993  . Arm wound repair / closure      left   Family History  Problem Relation Age of Onset  . Heart disease Mother   . Stroke Mother   . Thyroid disease Mother   . Heart disease Father   . Heart disease Sister   . Heart disease Brother   . Thyroid disease Brother   . Cancer Paternal Grandmother     lung cancer  . Cancer Paternal Grandfather     prostate cancer  . Prostate cancer Paternal Uncle   . Colon cancer Neg Hx   . Nephrolithiasis Daughter   . Breast cancer Maternal Aunt     x2  . Hypertension Mother   . Hypertension Father   . Stroke Mother   . Emphysema Father    History  Substance Use  Topics  . Smoking status: Never Smoker   . Smokeless tobacco: Never Used  . Alcohol Use: No   OB History    Gravida Para Term Preterm AB TAB SAB Ectopic Multiple Living   2 2 2       2      Review of Systems  Constitutional: Negative for fever.  Eyes: Positive for photophobia. Negative for visual disturbance.  Respiratory: Negative for shortness of breath.   Cardiovascular: Negative for chest pain.  Gastrointestinal: Negative for vomiting and abdominal pain.  Musculoskeletal: Negative for neck pain and neck stiffness.  Neurological: Positive for headaches. Negative for weakness and numbness.  All other systems reviewed and are negative.     Allergies  Review of patient's allergies indicates no known allergies.  Home Medications   Prior to Admission medications   Medication Sig Start Date End Date Taking? Authorizing Provider  ALPRAZolam Duanne Moron) 0.5 MG tablet Take 1 tablet (0.5 mg total) by mouth daily as needed for anxiety. 08/03/13   [name] S Jeffery, PA-C  amoxicillin-clavulanate (AUGMENTIN) 875-125 MG per tablet Take 1 tablet by mouth 2 (two) times daily. 08/01/14   [name] Janalee Dane, PA-C  chlorpheniramine-HYDROcodone (TUSSIONEX PENNKINETIC ER) 10-8 MG/5ML LQCR Take 5 mLs by mouth every  12 (twelve) hours as needed for cough (cough). 08/01/14   [name] S Jeffery, PA-C  escitalopram (LEXAPRO) 20 MG tablet Take 1 tablet (20 mg total) by mouth daily. 02/03/14   [name] S Jeffery, PA-C  ipratropium (ATROVENT) 0.03 % nasal spray Place 2 sprays into both nostrils 2 (two) times daily. 08/01/14   [name] S Jeffery, PA-C   BP 174/99 mmHg  Pulse 86  Temp(Src) 98.1 F (36.7 C) (Oral)  Resp 18  Ht 5\' 5"  (1.651 m)  Wt 200 lb (90.719 kg)  BMI 33.28 kg/m2  SpO2 97%  LMP 10/14/2008 Physical Exam  Constitutional: She is oriented to person, place, and time. She appears well-developed and well-nourished.  HENT:  Head: Normocephalic and atraumatic.  Right Ear: External ear normal.  Left  Ear: External ear normal.  Nose: Nose normal.  Eyes: EOM are normal. Pupils are equal, round, and reactive to light. Right eye exhibits no discharge. Left eye exhibits no discharge.  Cardiovascular: Normal rate, regular rhythm and normal heart sounds.   Pulmonary/Chest: Effort normal and breath sounds normal.  Abdominal: Soft. There is no tenderness.  Neurological: She is alert and oriented to person, place, and time.  CN 2-12 grossly intact. 5/5 strength in all 4 extremities. Normal gross sensation. Normal finger to nose. No pronator drift.  Skin: Skin is warm and dry.  Vitals reviewed.   ED Course  Procedures (including critical care time) Labs Review Labs Reviewed  BASIC METABOLIC PANEL - Abnormal; Notable for the following:    Potassium 3.4 (*)    Glucose, Bld 113 (*)    Anion gap 0 (*)    All other components within normal limits  CBC WITH DIFFERENTIAL/PLATELET    Imaging Review Ct Head Wo Contrast  12/20/2014   CLINICAL DATA:  Severe headache for 6 hr. Right-sided headache. Hypertension.  EXAM: CT HEAD WITHOUT CONTRAST  TECHNIQUE: Contiguous axial images were obtained from the base of the skull through the vertex without intravenous contrast.  COMPARISON:  None.  FINDINGS: Ventricles and sulci appear symmetrical. No mass effect or midline shift. No abnormal extra-axial fluid collections. Gray-white matter junctions are distinct. Basal cisterns are not effaced. No evidence of acute intracranial hemorrhage. No depressed skull fractures. Visualized paranasal sinuses and mastoid air cells are not opacified. Vascular calcifications.  IMPRESSION: No acute intracranial abnormalities.   Electronically Signed   By: Lucienne Capers M.D.   On: 12/20/2014 21:48     EKG Interpretation None      MDM   Final diagnoses:  Acute nonintractable headache, unspecified headache type  Essential hypertension    Patient's headache resolved after IV treatment. Ct obtained within 6 hours of  onset, and is negative. Do not feel further SAH workup is indicated. No neuro abnormalities on exam. Not c/w meningitis/encephalitis. Difficult to tell if it is due to hypertension or is hypertensive from pain. Given no pain now and still hypertensive, this is likely partially contributing. Discussed with patient, she prefers to check BP at home and f/u with PCP for possible re-starting of pain meds.     Sherwood Gambler, MD 12/21/14 [phone]

## 2015-01-03 ENCOUNTER — Ambulatory Visit (INDEPENDENT_AMBULATORY_CARE_PROVIDER_SITE_OTHER): Payer: 59 | Admitting: Physician Assistant

## 2015-01-03 VITALS — BP 118/76 | HR 81 | Temp 98.5°F | Resp 18 | Ht 66.0 in | Wt 205.6 lb

## 2015-01-03 DIAGNOSIS — F32A Depression, unspecified: Secondary | ICD-10-CM

## 2015-01-03 DIAGNOSIS — I10 Essential (primary) hypertension: Secondary | ICD-10-CM

## 2015-01-03 DIAGNOSIS — F329 Major depressive disorder, single episode, unspecified: Secondary | ICD-10-CM

## 2015-01-03 DIAGNOSIS — F419 Anxiety disorder, unspecified: Secondary | ICD-10-CM | POA: Diagnosis not present

## 2015-01-03 MED ORDER — ALPRAZOLAM 0.5 MG PO TABS: 0.5000 mg | ORAL_TABLET | Freq: Every day | ORAL | Status: DC | PRN

## 2015-01-03 NOTE — Progress Notes (Signed)
Patient ID: Jillian Steele, female    DOB: 1961/05/06, 54 y.o.   MRN: [id number]  PCP: Wynne Dust  Subjective:   Chief Complaint  Patient presents with  . Medication Refill    Lexapro, Wellbutrin, Lisinopril    HPI  Was seen in the ED on 3/08 with a terrible HA and had significantly elevated BP. It wasn't clear which was causing which, or if these were two separate and unrelated issues. Had recently had a terrible bout of sinus trouble, and had been taking OTC cold preparations. She's following up today to restart lisinopril.  Self d/c'd lexapro. Lisinopril was D/C'd due to good control. Occasionally uses alprazolam. She thinks she needs to resume "everything." However, she also states that she feels good. Not depressed. Likes to have alprazolam available, but rarely takes it.  She is scheduled to see GYN next month for pap and breast exam. Will have labs drawn then and will ask to have HIV screening.  Review of Systems Review of Systems No chest pain, SOB, HA, dizziness, vision change, N/V, diarrhea, constipation, dysuria, urinary urgency or frequency, myalgias, arthralgias or rash.    Patient Active Problem List   Diagnosis Date Noted  . Depression 11/12/2011  . Hypertension 11/12/2011  . Anxiety 11/12/2011     Prior to Admission medications   Medication Sig Start Date End Date Taking? Authorizing Provider  ALPRAZolam Duanne Moron) 0.5 MG tablet Take 1 tablet (0.5 mg total) by mouth daily as needed for anxiety. 08/03/13  Yes Trinidad Ingle S Oaklie Durrett, PA-C  ipratropium (ATROVENT) 0.03 % nasal spray Place 2 sprays into both nostrils 2 (two) times daily. Patient not taking: Reported on 01/03/2015 08/01/14   Elijah Michaelis S Ociel Retherford, PA-C     No Known Allergies     Objective:  Physical Exam  Physical Exam  Constitutional: She is oriented to person, place, and time. Vital signs are normal. She appears well-developed and well-nourished. She is active and cooperative. No distress.  BP  118/76 mmHg  Pulse 81  Temp(Src) 98.5 F (36.9 C) (Oral)  Resp 18  Ht 5\' 6"  (1.676 m)  Wt 205 lb 9.6 oz (93.26 kg)  BMI 33.20 kg/m2  SpO2 97%  LMP 10/14/2008  HENT:  Head: Normocephalic and atraumatic.  Right Ear: Hearing normal.  Left Ear: Hearing normal.  Eyes: Conjunctivae are normal. No scleral icterus.  Neck: Normal range of motion. Neck supple. No thyromegaly present.  Cardiovascular: Normal rate, regular rhythm and normal heart sounds.   Pulses:      Radial pulses are 2+ on the right side, and 2+ on the left side.  Pulmonary/Chest: Effort normal and breath sounds normal.  Lymphadenopathy:       Head (right side): No tonsillar, no preauricular, no posterior auricular and no occipital adenopathy present.       Head (left side): No tonsillar, no preauricular, no posterior auricular and no occipital adenopathy present.    She has no cervical adenopathy.       Right: No supraclavicular adenopathy present.       Left: No supraclavicular adenopathy present.  Neurological: She is alert and oriented to person, place, and time. No sensory deficit.  Skin: Skin is warm, dry and intact. No rash noted. No cyanosis or erythema. Nails show no clubbing.  Psychiatric: She has a normal mood and affect. Her speech is normal and behavior is normal.           Assessment & Plan:  1. Essential hypertension Well controlled without  medication. Suspect that sinus illness and OTC decongestant therapy caused elevation seen in the ED. No recurrent HA. She'll monitor BP.  2. Anxiety Controlled, with occasional use of alprazolam. - ALPRAZolam (XANAX) 0.5 MG tablet; Take 1 tablet (0.5 mg total) by mouth daily as needed for anxiety.  Dispense: 30 tablet; Refill: 0  3. Depression Resolved without medication.   Fara Chute, PA-C Physician Assistant-Certified [facility]

## 2015-01-13 ENCOUNTER — Ambulatory Visit: Payer: BC Managed Care – PPO | Admitting: Gynecology

## 2015-01-16 ENCOUNTER — Encounter: Payer: Self-pay | Admitting: Nurse Practitioner

## 2015-01-16 ENCOUNTER — Ambulatory Visit (INDEPENDENT_AMBULATORY_CARE_PROVIDER_SITE_OTHER): Payer: 59 | Admitting: Nurse Practitioner

## 2015-01-16 VITALS — BP 136/94 | HR 72 | Ht 65.0 in | Wt 203.0 lb

## 2015-01-16 DIAGNOSIS — Z01419 Encounter for gynecological examination (general) (routine) without abnormal findings: Secondary | ICD-10-CM

## 2015-01-16 DIAGNOSIS — Z0000 Encounter for general adult medical examination without abnormal findings: Secondary | ICD-10-CM | POA: Diagnosis not present

## 2015-01-16 LAB — COMPREHENSIVE METABOLIC PANEL
ALBUMIN: 4.3 g/dL (ref 3.5–5.2)
ALK PHOS: 92 U/L (ref 39–117)
ALT: 24 U/L (ref 0–35)
AST: 16 U/L (ref 0–37)
BUN: 15 mg/dL (ref 6–23)
CALCIUM: 10 mg/dL (ref 8.4–10.5)
CHLORIDE: 102 meq/L (ref 96–112)
CO2: 27 mEq/L (ref 19–32)
Creat: 0.8 mg/dL (ref 0.50–1.10)
GLUCOSE: 96 mg/dL (ref 70–99)
Potassium: 4.4 mEq/L (ref 3.5–5.3)
Sodium: 141 mEq/L (ref 135–145)
Total Bilirubin: 0.6 mg/dL (ref 0.2–1.2)
Total Protein: 7.1 g/dL (ref 6.0–8.3)

## 2015-01-16 LAB — POCT URINALYSIS DIPSTICK
Bilirubin, UA: NEGATIVE
Blood, UA: NEGATIVE
GLUCOSE UA: NEGATIVE
Ketones, UA: NEGATIVE
Leukocytes, UA: NEGATIVE
Nitrite, UA: NEGATIVE
Protein, UA: NEGATIVE
Urobilinogen, UA: NEGATIVE
pH, UA: 5

## 2015-01-16 LAB — TSH: TSH: 3.522 u[IU]/mL (ref 0.350–4.500)

## 2015-01-16 LAB — LIPID PANEL
CHOLESTEROL: 172 mg/dL (ref 0–200)
HDL: 40 mg/dL — AB (ref >=46)
LDL Cholesterol: 102 mg/dL — ABNORMAL HIGH (ref 0–99)
Total CHOL/HDL Ratio: 4.3 Ratio
Triglycerides: 149 mg/dL (ref <150)
VLDL: 30 mg/dL (ref 0–40)

## 2015-01-16 NOTE — Patient Instructions (Signed)

## 2015-01-16 NOTE — Progress Notes (Signed)
Patient ID: Jillian Steele, female   DOB: 06/26/1961, 54 y.o.   MRN: [id number] 54 y.o. G20P2002 Married  Caucasian Fe here for annual exam.  Off POP in 11/2011 with no bleeding or spotting.  She did not have any vaso symptoms for quite some time - now for past 6 months increase in vaso symptoms.  So far they ar tolerable.  She was seen in ED for a migraine HA, not sure if that was the cause of elevated BP or recent cold med's.  She has been rechecked by PCP afterwards and is doing a BP diary.  She feels well otherwise and working a lot of 12 hour shifts.  Patient's last menstrual period was 10/14/2010.          Sexually active: Yes.    The current method of family planning is post menopausal status.    Exercising: No.  The patient does not participate in regular exercise at present. Smoker:  no  Health Maintenance: Pap:  11/26/11, negative with neg HR HPV MMG:  01/12/2015, [facility], no results at time of visit Colonoscopy:  05/14/13, tubular adenoma, repeat in 5 years BMD:   Never  TDaP:  2008 Labs:  HB:  13.6  Urine:  Negative    reports that she has never smoked. She has never used smokeless tobacco. She reports that she does not drink alcohol or use illicit drugs.  Past Medical History  Diagnosis Date  . Anxiety   . Hypertension   . Depression   . Fibroid 11/2013    fundal - 2 1/2 cm sac mucosal  . Hormone replacement therapy (postmenopausal) 11/12/2011    Past Surgical History  Procedure Laterality Date  . Eye surgery      Lasik  . Tubal ligation  1997  . Knee arthroscopy Left 1993  . Arm wound repair / closure      left    Current Outpatient Prescriptions  Medication Sig Dispense Refill  . ALPRAZolam (XANAX) 0.5 MG tablet Take 1 tablet (0.5 mg total) by mouth daily as needed for anxiety. 30 tablet 0   No current facility-administered medications for this visit.    Family History  Problem Relation Age of Onset  . Heart disease Mother   . Stroke Mother   . Thyroid disease  Mother   . Heart disease Father   . Heart disease Sister   . Heart disease Brother   . Thyroid disease Brother   . Cancer Paternal Grandmother     lung cancer  . Cancer Paternal Grandfather     prostate cancer  . Prostate cancer Paternal Uncle   . Colon cancer Neg Hx   . Nephrolithiasis Daughter   . Breast cancer Maternal Aunt     x2  . Hypertension Mother   . Hypertension Father   . Stroke Mother   . Emphysema Father     ROS:  Pertinent items are noted in HPI.  Otherwise, a comprehensive ROS was negative.  Exam:   BP 136/94 mmHg  Pulse 72  Ht 5\' 5"  (1.651 m)  Wt 203 lb (92.08 kg)  BMI 33.78 kg/m2  LMP 10/14/2010 Height: 5\' 5"  (165.1 cm) Ht Readings from Last 3 Encounters:  01/16/15 5\' 5"  (1.651 m)  01/03/15 5\' 6"  (1.676 m)  12/20/14 5\' 5"  (1.651 m)    General appearance: alert, cooperative and appears stated age Head: Normocephalic, without obvious abnormality, atraumatic Neck: no adenopathy, supple, symmetrical, trachea midline and thyroid normal to inspection and palpation  Lungs: clear to auscultation bilaterally Breasts: normal appearance, no masses or tenderness Heart: regular rate and rhythm Abdomen: soft, non-tender; no masses,  no organomegaly Extremities: extremities normal, atraumatic, no cyanosis or edema Skin: Skin color, texture, turgor normal. No rashes or lesions Lymph nodes: Cervical, supraclavicular, and axillary nodes normal. No abnormal inguinal nodes palpated Neurologic: Grossly normal   Pelvic: External genitalia:  no lesions              Urethra:  normal appearing urethra with no masses, tenderness or lesions              Bartholin's and Skene's: normal                 Vagina: normal appearing vagina with normal color and discharge, no lesions              Cervix: anteverted              Pap taken: Yes.   Bimanual Exam:  Uterus:  normal size, contour, position, consistency, mobility, non-tender              Adnexa: no mass, fullness,  tenderness               Rectovaginal: Confirms.  A lot of hard stool in the rectum                Anus:  normal sphincter tone, no lesions  Chaperone present: yes  A:  Well Woman with normal exam  Menopausal no HRT  History of obesity  Elevated BP and following with PCP  P:   Reviewed health and wellness pertinent to exam  Pap smear taken today  Mammogram is due 12/2015 if this one is normal  Will follow with labs - HIV, Hep B is now required per insurance  Counseled on breast self exam, mammography screening, menopause, adequate intake of calcium and vitamin D, diet and exercise, Kegel's exercises return annually or prn  An After Visit Summary was printed and given to the patient.

## 2015-01-17 LAB — VITAMIN D 25 HYDROXY (VIT D DEFICIENCY, FRACTURES): Vit D, 25-Hydroxy: 26 ng/mL — ABNORMAL LOW (ref 30–100)

## 2015-01-17 LAB — HEMOGLOBIN, FINGERSTICK: HEMOGLOBIN, FINGERSTICK: 13.6 g/dL (ref 12.0–16.0)

## 2015-01-17 LAB — STD PANEL
HIV 1&2 Ab, 4th Generation: NONREACTIVE
Hepatitis B Surface Ag: NEGATIVE

## 2015-01-18 LAB — IPS PAP TEST WITH HPV

## 2015-01-19 NOTE — Progress Notes (Signed)
Encounter reviewed by Dr. Brook Silva.  

## 2015-06-04 ENCOUNTER — Ambulatory Visit (INDEPENDENT_AMBULATORY_CARE_PROVIDER_SITE_OTHER): Payer: 59 | Admitting: Physician Assistant

## 2015-06-04 VITALS — BP 128/88 | HR 84 | Temp 97.6°F | Resp 16 | Ht 65.5 in | Wt 206.0 lb

## 2015-06-04 DIAGNOSIS — M5431 Sciatica, right side: Secondary | ICD-10-CM | POA: Diagnosis not present

## 2015-06-04 DIAGNOSIS — R350 Frequency of micturition: Secondary | ICD-10-CM | POA: Diagnosis not present

## 2015-06-04 DIAGNOSIS — R319 Hematuria, unspecified: Secondary | ICD-10-CM | POA: Diagnosis not present

## 2015-06-04 LAB — POCT URINALYSIS DIPSTICK
Glucose, UA: NEGATIVE
KETONES UA: NEGATIVE
Nitrite, UA: NEGATIVE
Protein, UA: NEGATIVE
Spec Grav, UA: 1.025
Urobilinogen, UA: 0.2
pH, UA: 5.5

## 2015-06-04 LAB — POCT UA - MICROSCOPIC ONLY
CASTS, UR, LPF, POC: NEGATIVE
Crystals, Ur, HPF, POC: NEGATIVE
MUCUS UA: NEGATIVE
Yeast, UA: NEGATIVE

## 2015-06-04 MED ORDER — SULFAMETHOXAZOLE-TRIMETHOPRIM 800-160 MG PO TABS: 1.0000 | ORAL_TABLET | Freq: Two times a day (BID) | ORAL | Status: DC

## 2015-06-04 NOTE — Patient Instructions (Signed)
Sciatica with Rehab The sciatic nerve runs from the back down the leg and is responsible for sensation and control of the muscles in the back (posterior) side of the thigh, lower leg, and foot. Sciatica is a condition that is characterized by inflammation of this nerve.  SYMPTOMS   Signs of nerve damage, including numbness and/or weakness along the posterior side of the lower extremity.  Pain in the back of the thigh that may also travel down the leg.  Pain that worsens when sitting for long periods of time.  Occasionally, pain in the back or buttock. CAUSES  Inflammation of the sciatic nerve is the cause of sciatica. The inflammation is due to something irritating the nerve. Common sources of irritation include:  Sitting for long periods of time.  Direct trauma to the nerve.  Arthritis of the spine.  Herniated or ruptured disk.  Slipping of the vertebrae (spondylolisthesis).  Pressure from soft tissues, such as muscles or ligament-like tissue (fascia). RISK INCREASES WITH:  Sports that place pressure or stress on the spine (football or weightlifting).  Poor strength and flexibility.  Failure to warm up properly before activity.  Family history of low back pain or disk disorders.  Previous back injury or surgery.  Poor body mechanics, especially when lifting, or poor posture. PREVENTION   Warm up and stretch properly before activity.  Maintain physical fitness:  Strength, flexibility, and endurance.  Cardiovascular fitness.  Learn and use proper technique, especially with posture and lifting. When possible, have coach correct improper technique.  Avoid activities that place stress on the spine. PROGNOSIS If treated properly, then sciatica usually resolves within 6 weeks. However, occasionally surgery is necessary.  RELATED COMPLICATIONS   Permanent nerve damage, including pain, numbness, tingle, or weakness.  Chronic back pain.  Risks of surgery: infection,  bleeding, nerve damage, or damage to surrounding tissues. TREATMENT Treatment initially involves resting from any activities that aggravate your symptoms. The use of ice and medication may help reduce pain and inflammation. The use of strengthening and stretching exercises may help reduce pain with activity. These exercises may be performed at home or with referral to a therapist. A therapist may recommend further treatments, such as transcutaneous electronic nerve stimulation (TENS) or ultrasound. Your caregiver may recommend corticosteroid injections to help reduce inflammation of the sciatic nerve. If symptoms persist despite non-surgical (conservative) treatment, then surgery may be recommended. MEDICATION  If pain medication is necessary, then nonsteroidal anti-inflammatory medications, such as aspirin and ibuprofen, or other minor pain relievers, such as acetaminophen, are often recommended.  Do not take pain medication for 7 days before surgery.  Prescription pain relievers may be given if deemed necessary by your caregiver. Use only as directed and only as much as you need.  Ointments applied to the skin may be helpful.  Corticosteroid injections may be given by your caregiver. These injections should be reserved for the most serious cases, because they may only be given a certain number of times. HEAT AND COLD  Cold treatment (icing) relieves pain and reduces inflammation. Cold treatment should be applied for 10 to 15 minutes every 2 to 3 hours for inflammation and pain and immediately after any activity that aggravates your symptoms. Use ice packs or massage the area with a piece of ice (ice massage).  Heat treatment may be used prior to performing the stretching and strengthening activities prescribed by your caregiver, physical therapist, or athletic trainer. Use a heat pack or soak the injury in warm water.   SEEK MEDICAL CARE IF:  Treatment seems to offer no benefit, or the condition  worsens.  Any medications produce adverse side effects. EXERCISES  RANGE OF MOTION (ROM) AND STRETCHING EXERCISES - Sciatica Most people with sciatic will find that their symptoms worsen with either excessive bending forward (flexion) or arching at the low back (extension). The exercises which will help resolve your symptoms will focus on the opposite motion. Your physician, physical therapist or athletic trainer will help you determine which exercises will be most helpful to resolve your low back pain. Do not complete any exercises without first consulting with your clinician. Discontinue any exercises which worsen your symptoms until you speak to your clinician. If you have pain, numbness or tingling which travels down into your buttocks, leg or foot, the goal of the therapy is for these symptoms to move closer to your back and eventually resolve. Occasionally, these leg symptoms will get better, but your low back pain may worsen; this is typically an indication of progress in your rehabilitation. Be certain to be very alert to any changes in your symptoms and the activities in which you participated in the 24 hours prior to the change. Sharing this information with your clinician will allow him/her to most efficiently treat your condition. These exercises may help you when beginning to rehabilitate your injury. Your symptoms may resolve with or without further involvement from your physician, physical therapist or athletic trainer. While completing these exercises, remember:   Restoring tissue flexibility helps normal motion to return to the joints. This allows healthier, less painful movement and activity.  An effective stretch should be held for at least 30 seconds.  A stretch should never be painful. You should only feel a gentle lengthening or release in the stretched tissue. FLEXION RANGE OF MOTION AND STRETCHING EXERCISES: STRETCH - Flexion, Single Knee to Chest   Lie on a firm bed or floor  with both legs extended in front of you.  Keeping one leg in contact with the floor, bring your opposite knee to your chest. Hold your leg in place by either grabbing behind your thigh or at your knee.  Pull until you feel a gentle stretch in your low back. Hold __________ seconds.  Slowly release your grasp and repeat the exercise with the opposite side. Repeat __________ times. Complete this exercise __________ times per day.  STRETCH - Flexion, Double Knee to Chest  Lie on a firm bed or floor with both legs extended in front of you.  Keeping one leg in contact with the floor, bring your opposite knee to your chest.  Tense your stomach muscles to support your back and then lift your other knee to your chest. Hold your legs in place by either grabbing behind your thighs or at your knees.  Pull both knees toward your chest until you feel a gentle stretch in your low back. Hold __________ seconds.  Tense your stomach muscles and slowly return one leg at a time to the floor. Repeat __________ times. Complete this exercise __________ times per day.  STRETCH - Low Trunk Rotation   Lie on a firm bed or floor. Keeping your legs in front of you, bend your knees so they are both pointed toward the ceiling and your feet are flat on the floor.  Extend your arms out to the side. This will stabilize your upper body by keeping your shoulders in contact with the floor.  Gently and slowly drop both knees together to one side until   you feel a gentle stretch in your low back. Hold for __________ seconds.  Tense your stomach muscles to support your low back as you bring your knees back to the starting position. Repeat the exercise to the other side. Repeat __________ times. Complete this exercise __________ times per day  EXTENSION RANGE OF MOTION AND FLEXIBILITY EXERCISES: STRETCH - Extension, Prone on Elbows  Lie on your stomach on the floor, a bed will be too soft. Place your palms about shoulder  width apart and at the height of your head.  Place your elbows under your shoulders. If this is too painful, stack pillows under your chest.  Allow your body to relax so that your hips drop lower and make contact more completely with the floor.  Hold this position for __________ seconds.  Slowly return to lying flat on the floor. Repeat __________ times. Complete this exercise __________ times per day.  RANGE OF MOTION - Extension, Prone Press Ups  Lie on your stomach on the floor, a bed will be too soft. Place your palms about shoulder width apart and at the height of your head.  Keeping your back as relaxed as possible, slowly straighten your elbows while keeping your hips on the floor. You may adjust the placement of your hands to maximize your comfort. As you gain motion, your hands will come more underneath your shoulders.  Hold this position __________ seconds.  Slowly return to lying flat on the floor. Repeat __________ times. Complete this exercise __________ times per day.  STRENGTHENING EXERCISES - Sciatica  These exercises may help you when beginning to rehabilitate your injury. These exercises should be done near your "sweet spot." This is the neutral, low-back arch, somewhere between fully rounded and fully arched, that is your least painful position. When performed in this safe range of motion, these exercises can be used for people who have either a flexion or extension based injury. These exercises may resolve your symptoms with or without further involvement from your physician, physical therapist or athletic trainer. While completing these exercises, remember:   Muscles can gain both the endurance and the strength needed for everyday activities through controlled exercises.  Complete these exercises as instructed by your physician, physical therapist or athletic trainer. Progress with the resistance and repetition exercises only as your caregiver advises.  You may  experience muscle soreness or fatigue, but the pain or discomfort you are trying to eliminate should never worsen during these exercises. If this pain does worsen, stop and make certain you are following the directions exactly. If the pain is still present after adjustments, discontinue the exercise until you can discuss the trouble with your clinician. STRENGTHENING - Deep Abdominals, Pelvic Tilt   Lie on a firm bed or floor. Keeping your legs in front of you, bend your knees so they are both pointed toward the ceiling and your feet are flat on the floor.  Tense your lower abdominal muscles to press your low back into the floor. This motion will rotate your pelvis so that your tail bone is scooping upwards rather than pointing at your feet or into the floor.  With a gentle tension and even breathing, hold this position for __________ seconds. Repeat __________ times. Complete this exercise __________ times per day.  STRENGTHENING - Abdominals, Crunches   Lie on a firm bed or floor. Keeping your legs in front of you, bend your knees so they are both pointed toward the ceiling and your feet are flat on the   floor. Cross your arms over your chest.  Slightly tip your chin down without bending your neck.  Tense your abdominals and slowly lift your trunk high enough to just clear your shoulder blades. Lifting higher can put excessive stress on the low back and does not further strengthen your abdominal muscles.  Control your return to the starting position. Repeat __________ times. Complete this exercise __________ times per day.  STRENGTHENING - Quadruped, Opposite UE/LE Lift  Assume a hands and knees position on a firm surface. Keep your hands under your shoulders and your knees under your hips. You may place padding under your knees for comfort.  Find your neutral spine and gently tense your abdominal muscles so that you can maintain this position. Your shoulders and hips should form a rectangle  that is parallel with the floor and is not twisted.  Keeping your trunk steady, lift your right hand no higher than your shoulder and then your left leg no higher than your hip. Make sure you are not holding your breath. Hold this position __________ seconds.  Continuing to keep your abdominal muscles tense and your back steady, slowly return to your starting position. Repeat with the opposite arm and leg. Repeat __________ times. Complete this exercise __________ times per day.  STRENGTHENING - Abdominals and Quadriceps, Straight Leg Raise   Lie on a firm bed or floor with both legs extended in front of you.  Keeping one leg in contact with the floor, bend the other knee so that your foot can rest flat on the floor.  Find your neutral spine, and tense your abdominal muscles to maintain your spinal position throughout the exercise.  Slowly lift your straight leg off the floor about 6 inches for a count of 15, making sure to not hold your breath.  Still keeping your neutral spine, slowly lower your leg all the way to the floor. Repeat this exercise with each leg __________ times. Complete this exercise __________ times per day. POSTURE AND BODY MECHANICS CONSIDERATIONS - Sciatica Keeping correct posture when sitting, standing or completing your activities will reduce the stress put on different body tissues, allowing injured tissues a chance to heal and limiting painful experiences. The following are general guidelines for improved posture. Your physician or physical therapist will provide you with any instructions specific to your needs. While reading these guidelines, remember:  The exercises prescribed by your provider will help you have the flexibility and strength to maintain correct postures.  The correct posture provides the optimal environment for your joints to work. All of your joints have less wear and tear when properly supported by a spine with good posture. This means you will  experience a healthier, less painful body.  Correct posture must be practiced with all of your activities, especially prolonged sitting and standing. Correct posture is as important when doing repetitive low-stress activities (typing) as it is when doing a single heavy-load activity (lifting). RESTING POSITIONS Consider which positions are most painful for you when choosing a resting position. If you have pain with flexion-based activities (sitting, bending, stooping, squatting), choose a position that allows you to rest in a less flexed posture. You would want to avoid curling into a fetal position on your side. If your pain worsens with extension-based activities (prolonged standing, working overhead), avoid resting in an extended position such as sleeping on your stomach. Most people will find more comfort when they rest with their spine in a more neutral position, neither too rounded nor too   arched. Lying on a non-sagging bed on your side with a pillow between your knees, or on your back with a pillow under your knees will often provide some relief. Keep in mind, being in any one position for a prolonged period of time, no matter how correct your posture, can still lead to stiffness. PROPER SITTING POSTURE In order to minimize stress and discomfort on your spine, you must sit with correct posture Sitting with good posture should be effortless for a healthy body. Returning to good posture is a gradual process. Many people can work toward this most comfortably by using various supports until they have the flexibility and strength to maintain this posture on their own. When sitting with proper posture, your ears will fall over your shoulders and your shoulders will fall over your hips. You should use the back of the chair to support your upper back. Your low back will be in a neutral position, just slightly arched. You may place a small pillow or folded towel at the base of your low back for support.  When  working at a desk, create an environment that supports good, upright posture. Without extra support, muscles fatigue and lead to excessive strain on joints and other tissues. Keep these recommendations in mind: CHAIR:   A chair should be able to slide under your desk when your back makes contact with the back of the chair. This allows you to work closely.  The chair's height should allow your eyes to be level with the upper part of your monitor and your hands to be slightly lower than your elbows. BODY POSITION  Your feet should make contact with the floor. If this is not possible, use a foot rest.  Keep your ears over your shoulders. This will reduce stress on your neck and low back. INCORRECT SITTING POSTURES   If you are feeling tired and unable to assume a healthy sitting posture, do not slouch or slump. This puts excessive strain on your back tissues, causing more damage and pain. Healthier options include:  Using more support, like a lumbar pillow.  Switching tasks to something that requires you to be upright or walking.  Talking a brief walk.  Lying down to rest in a neutral-spine position. PROLONGED STANDING WHILE SLIGHTLY LEANING FORWARD  When completing a task that requires you to lean forward while standing in one place for a long time, place either foot up on a stationary 2-4 inch high object to help maintain the best posture. When both feet are on the ground, the low back tends to lose its slight inward curve. If this curve flattens (or becomes too large), then the back and your other joints will experience too much stress, fatigue more quickly and can cause pain.  CORRECT STANDING POSTURES Proper standing posture should be assumed with all daily activities, even if they only take a few moments, like when brushing your teeth. As in sitting, your ears should fall over your shoulders and your shoulders should fall over your hips. You should keep a slight tension in your abdominal  muscles to brace your spine. Your tailbone should point down to the ground, not behind your body, resulting in an over-extended swayback posture.  INCORRECT STANDING POSTURES  Common incorrect standing postures include a forward head, locked knees and/or an excessive swayback. WALKING Walk with an upright posture. Your ears, shoulders and hips should all line-up. PROLONGED ACTIVITY IN A FLEXED POSITION When completing a task that requires you to bend forward   at your waist or lean over a low surface, try to find a way to stabilize 3 of 4 of your limbs. You can place a hand or elbow on your thigh or rest a knee on the surface you are reaching across. This will provide you more stability so that your muscles do not fatigue as quickly. By keeping your knees relaxed, or slightly bent, you will also reduce stress across your low back. CORRECT LIFTING TECHNIQUES DO :   Assume a wide stance. This will provide you more stability and the opportunity to get as close as possible to the object which you are lifting.  Tense your abdominals to brace your spine; then bend at the knees and hips. Keeping your back locked in a neutral-spine position, lift using your leg muscles. Lift with your legs, keeping your back straight.  Test the weight of unknown objects before attempting to lift them.  Try to keep your elbows locked down at your sides in order get the best strength from your shoulders when carrying an object.  Always ask for help when lifting heavy or awkward objects. INCORRECT LIFTING TECHNIQUES DO NOT:   Lock your knees when lifting, even if it is a small object.  Bend and twist. Pivot at your feet or move your feet when needing to change directions.  Assume that you cannot safely pick up a paperclip without proper posture. Document Released: 09/30/2005 Document Revised: 02/14/2014 Document Reviewed: 01/12/2009 [employer] Patient Information 2015 [employer]. This information is not intended to  replace advice given to you by your health care provider. Make sure you discuss any questions you have with your health care provider.  

## 2015-06-04 NOTE — Progress Notes (Signed)
Patient ID: Jillian Steele, female    DOB: March 14, 1961, 54 y.o.   MRN: [id number]  PCP: Wynne Dust  Subjective:   Chief Complaint  Patient presents with  . Urinary Tract Infection    x1 day  . Urinary Frequency    x 1 day  . Hematuria    x 1 day    HPI Presents for evaluation of urinary symptoms.  Awoke at 4 am with urinary urgency. Frequency and urgency persistent since then. Noted blood with wiping. No fever, chills, nausea, vomiting.  Leaves for a trip to [place] in 2 days.  Monogamous sex with her husband. No vaginal symptoms.  Has traveled more this summer. Has noticed pain in the buttock on the RIGHT, radiates down the RIGHT leg. No pain with walking. Often has to reposition when lying down. Sitting is most comfortable. Uses ibuprofen. Seems to happen more frequently.    Review of Systems  Constitutional: Negative for fever, chills, diaphoresis and fatigue.  Gastrointestinal: Negative for abdominal pain and abdominal distention.  Genitourinary: Positive for dysuria, urgency, frequency and hematuria. Negative for vaginal discharge and difficulty urinating.  Musculoskeletal: Negative for back pain.       RIGHT buttock pain that radiates down the RIGHT leg       Patient Active Problem List   Diagnosis Date Noted  . Hypertension 11/12/2011  . Anxiety 11/12/2011     Prior to Admission medications   Medication Sig Start Date End Date Taking? Authorizing Provider  ALPRAZolam Duanne Moron) 0.5 MG tablet Take 1 tablet (0.5 mg total) by mouth daily as needed for anxiety. 01/03/15  Yes Efrem Pitstick, PA-C     No Known Allergies     Objective:  Physical Exam  Constitutional: She is oriented to person, place, and time. Vital signs are normal. She appears well-developed and well-nourished. She is active and cooperative. No distress.  BP 128/88 mmHg  Pulse 84  Temp(Src) 97.6 F (36.4 C) (Oral)  Resp 16  Ht 5' 5.5" (1.664 m)  Wt 206 lb (93.441 kg)  BMI  33.75 kg/m2  SpO2 98%  LMP 10/14/2010  HENT:  Head: Normocephalic and atraumatic.  Right Ear: Hearing normal.  Left Ear: Hearing normal.  Eyes: Conjunctivae are normal. No scleral icterus.  Neck: Normal range of motion. Neck supple. No thyromegaly present.  Cardiovascular: Normal rate, regular rhythm and normal heart sounds.   Pulses:      Radial pulses are 2+ on the right side, and 2+ on the left side.  Pulmonary/Chest: Effort normal and breath sounds normal.  Abdominal: Soft. There is no tenderness (palpation in the suprapubic area causes sensation of urinary urgency.).  Lymphadenopathy:       Head (right side): No tonsillar, no preauricular, no posterior auricular and no occipital adenopathy present.       Head (left side): No tonsillar, no preauricular, no posterior auricular and no occipital adenopathy present.    She has no cervical adenopathy.       Right: No supraclavicular adenopathy present.       Left: No supraclavicular adenopathy present.  Neurological: She is alert and oriented to person, place, and time. No sensory deficit.  Skin: Skin is warm, dry and intact. No rash noted. No cyanosis or erythema. Nails show no clubbing.  Psychiatric: She has a normal mood and affect. Her speech is normal and behavior is normal.           Assessment & Plan:   1. Urinary frequency 2.  Hematuria Start SMP-TMX. Fluids. Rest. Await UCx. - POCT urinalysis dipstick - POCT UA - Microscopic Only - sulfamethoxazole-trimethoprim (BACTRIM DS,SEPTRA DS) 800-160 MG per tablet; Take 1 tablet by mouth 2 (two) times daily.  Dispense: 10 tablet; Refill: 0 - Urine culture  3. Sciatica, right Ice. Stretching exercises. Anticipatory guidance.   Fara Chute, PA-C Physician Assistant-Certified [facility]

## 2015-06-06 LAB — URINE CULTURE: Colony Count: 65000

## 2015-09-21 ENCOUNTER — Ambulatory Visit (INDEPENDENT_AMBULATORY_CARE_PROVIDER_SITE_OTHER): Payer: 59 | Admitting: Physician Assistant

## 2015-09-21 VITALS — BP 148/90 | HR 82 | Temp 98.4°F | Resp 16 | Ht 66.0 in | Wt 203.0 lb

## 2015-09-21 DIAGNOSIS — J0100 Acute maxillary sinusitis, unspecified: Secondary | ICD-10-CM

## 2015-09-21 MED ORDER — AMOXICILLIN-POT CLAVULANATE 875-125 MG PO TABS: 1.0000 | ORAL_TABLET | Freq: Two times a day (BID) | ORAL | Status: AC

## 2015-09-21 MED ORDER — AZELASTINE HCL 0.1 % NA SOLN: 2.0000 | Freq: Two times a day (BID) | NASAL | Status: DC

## 2015-09-21 MED ORDER — HYDROCOD POLST-CPM POLST ER 10-8 MG/5ML PO SUER: 5.0000 mL | Freq: Two times a day (BID) | ORAL | Status: DC | PRN

## 2015-09-21 NOTE — Progress Notes (Signed)
Patient ID: [name], female    DOB: 1960/10/25, 54 y.o.   MRN: [id number]  PCP: Wynne Dust  Subjective:   Chief Complaint  Patient presents with  . Cough    Non productive x 4days  . Nasal Congestion  . Chills  . Headache    HPI Presents for evaluation of congestion, cough, facial pressure/pain, sinus congestion. Headache. No fever/chills. No body aches. No nausea/vomiting. No diarrhea. NyQuil, Mucinex DM aren't helpful.  Husband has had similar symptoms for 3 weeks.  Review of Systems  Constitutional: Positive for fatigue. Negative for fever, chills, activity change, appetite change and unexpected weight change.  HENT: Positive for congestion, postnasal drip, rhinorrhea, sinus pressure and sore throat. Negative for dental problem, ear pain, hearing loss, mouth sores, sneezing, tinnitus and trouble swallowing.   Eyes: Negative for photophobia, pain, redness and visual disturbance.  Respiratory: Positive for cough (keeping her awake at night). Negative for chest tightness and shortness of breath.   Cardiovascular: Negative for chest pain, palpitations and leg swelling.  Gastrointestinal: Negative for nausea, vomiting, abdominal pain, diarrhea, constipation and blood in stool.  Genitourinary: Negative for dysuria, urgency, frequency and hematuria.  Musculoskeletal: Negative for myalgias, arthralgias, gait problem and neck stiffness.  Skin: Negative for rash.  Neurological: Negative for dizziness, speech difficulty, weakness, light-headedness, numbness and headaches.  Hematological: Negative for adenopathy.  Psychiatric/Behavioral: Negative for confusion and sleep disturbance. The patient is not nervous/anxious.        Patient Active Problem List   Diagnosis Date Noted  . Hypertension 11/12/2011  . Anxiety 11/12/2011     Prior to Admission medications   Medication Sig Start Date End Date Taking? Authorizing Provider  ALPRAZolam Duanne Moron) 0.5 MG tablet  Take 1 tablet (0.5 mg total) by mouth daily as needed for anxiety. 01/03/15  Yes Harbert Fitterer, PA-C  dextromethorphan-guaiFENesin (MUCINEX DM) 30-600 MG 12hr tablet Take 1 tablet by mouth 2 (two) times daily.   Yes Historical Provider, MD  sulfamethoxazole-trimethoprim (BACTRIM DS,SEPTRA DS) 800-160 MG per tablet Take 1 tablet by mouth 2 (two) times daily. Patient not taking: Reported on 09/21/2015 06/04/15   Harrison Mons, PA-C     No Known Allergies     Objective:  Physical Exam  Constitutional: She is oriented to person, place, and time. Vital signs are normal. She appears well-developed and well-nourished. She is active and cooperative. No distress.  BP 148/90 mmHg  Pulse 82  Temp(Src) 98.4 F (36.9 C) (Oral)  Resp 16  Ht 5\' 6"  (1.676 m)  Wt 203 lb (92.08 kg)  BMI 32.78 kg/m2  SpO2 98%  LMP 10/14/2010   HENT:  Head: Normocephalic and atraumatic.  Right Ear: Hearing, tympanic membrane, external ear and ear canal normal.  Left Ear: Hearing, tympanic membrane, external ear and ear canal normal.  Nose: Mucosal edema and rhinorrhea present.  No foreign bodies. Right sinus exhibits maxillary sinus tenderness. Right sinus exhibits no frontal sinus tenderness. Left sinus exhibits maxillary sinus tenderness. Left sinus exhibits no frontal sinus tenderness.  Mouth/Throat: Uvula is midline, oropharynx is clear and moist and mucous membranes are normal. No uvula swelling. No oropharyngeal exudate.  Eyes: Conjunctivae and EOM are normal. Pupils are equal, round, and reactive to light. Right eye exhibits no discharge. Left eye exhibits no discharge. No scleral icterus.  Neck: Trachea normal, normal range of motion and full passive range of motion without pain. Neck supple. No thyroid mass and no thyromegaly present.  Cardiovascular: Normal rate, regular rhythm and normal  heart sounds.   Pulmonary/Chest: Effort normal and breath sounds normal.  Lymphadenopathy:       Head (right side): No  submandibular, no tonsillar, no preauricular, no posterior auricular and no occipital adenopathy present.       Head (left side): No submandibular, no tonsillar, no preauricular and no occipital adenopathy present.    She has no cervical adenopathy.       Right: No supraclavicular adenopathy present.       Left: No supraclavicular adenopathy present.  Neurological: She is alert and oriented to person, place, and time. She has normal strength. No cranial nerve deficit or sensory deficit.  Skin: Skin is warm, dry and intact. No rash noted.  Psychiatric: She has a normal mood and affect. Her speech is normal and behavior is normal.           Assessment & Plan:   1. Subacute maxillary sinusitis Supportive care.  Anticipatory guidance.  RTC if symptoms worsen/persist. - amoxicillin-clavulanate (AUGMENTIN) 875-125 MG tablet; Take 1 tablet by mouth 2 (two) times daily.  Dispense: 20 tablet; Refill: 0 - chlorpheniramine-HYDROcodone (TUSSIONEX PENNKINETIC ER) 10-8 MG/5ML SUER; Take 5 mLs by mouth every 12 (twelve) hours as needed for cough.  Dispense: 100 mL; Refill: 0 - azelastine (ASTELIN) 0.1 % nasal spray; Place 2 sprays into both nostrils 2 (two) times daily. Use in each nostril as directed  Dispense: 30 mL; Refill: 0   Fara Chute, PA-C Physician Assistant-Certified [facility]

## 2015-09-21 NOTE — Patient Instructions (Signed)
Continue the Mucinex. Get plenty of rest and drink at least 64 ounces of water daily.  

## 2016-01-19 ENCOUNTER — Ambulatory Visit (INDEPENDENT_AMBULATORY_CARE_PROVIDER_SITE_OTHER): Payer: 59 | Admitting: Nurse Practitioner

## 2016-01-19 ENCOUNTER — Encounter: Payer: Self-pay | Admitting: Nurse Practitioner

## 2016-01-19 VITALS — BP 128/82 | HR 60 | Resp 20 | Ht 65.0 in | Wt 200.0 lb

## 2016-01-19 DIAGNOSIS — Z0000 Encounter for general adult medical examination without abnormal findings: Secondary | ICD-10-CM

## 2016-01-19 DIAGNOSIS — Z01419 Encounter for gynecological examination (general) (routine) without abnormal findings: Secondary | ICD-10-CM | POA: Diagnosis not present

## 2016-01-19 DIAGNOSIS — E559 Vitamin D deficiency, unspecified: Secondary | ICD-10-CM | POA: Diagnosis not present

## 2016-01-19 DIAGNOSIS — R0600 Dyspnea, unspecified: Secondary | ICD-10-CM | POA: Diagnosis not present

## 2016-01-19 LAB — TSH: TSH: 2.74 mIU/L

## 2016-01-19 LAB — CBC WITH DIFFERENTIAL/PLATELET
BASOS PCT: 0 %
Basophils Absolute: 0 cells/uL (ref 0–200)
EOS ABS: 77 {cells}/uL (ref 15–500)
Eosinophils Relative: 1 %
HEMATOCRIT: 41.4 % (ref 35.0–45.0)
Hemoglobin: 13.8 g/dL (ref 11.7–15.5)
LYMPHS PCT: 32 %
Lymphs Abs: 2464 cells/uL (ref 850–3900)
MCH: 27.5 pg (ref 27.0–33.0)
MCHC: 33.3 g/dL (ref 32.0–36.0)
MCV: 82.5 fL (ref 80.0–100.0)
MONO ABS: 539 {cells}/uL (ref 200–950)
MPV: 9.2 fL (ref 7.5–12.5)
Monocytes Relative: 7 %
Neutro Abs: 4620 cells/uL (ref 1500–7800)
Neutrophils Relative %: 60 %
Platelets: 247 10*3/uL (ref 140–400)
RBC: 5.02 MIL/uL (ref 3.80–5.10)
RDW: 14.6 % (ref 11.0–15.0)
WBC: 7.7 10*3/uL (ref 3.8–10.8)

## 2016-01-19 LAB — HEMOGLOBIN, FINGERSTICK: HEMOGLOBIN, FINGERSTICK: 13.4 g/dL (ref 12.0–16.0)

## 2016-01-19 LAB — COMPREHENSIVE METABOLIC PANEL
ALBUMIN: 4.3 g/dL (ref 3.6–5.1)
ALT: 22 U/L (ref 6–29)
AST: 16 U/L (ref 10–35)
Alkaline Phosphatase: 89 U/L (ref 33–130)
BILIRUBIN TOTAL: 0.6 mg/dL (ref 0.2–1.2)
BUN: 13 mg/dL (ref 7–25)
CO2: 24 mmol/L (ref 20–31)
CREATININE: 0.75 mg/dL (ref 0.50–1.05)
Calcium: 9.8 mg/dL (ref 8.6–10.4)
Chloride: 104 mmol/L (ref 98–110)
Glucose, Bld: 81 mg/dL (ref 65–99)
Potassium: 4.3 mmol/L (ref 3.5–5.3)
SODIUM: 139 mmol/L (ref 135–146)
TOTAL PROTEIN: 7 g/dL (ref 6.1–8.1)

## 2016-01-19 LAB — LIPID PANEL
CHOLESTEROL: 168 mg/dL (ref 125–200)
HDL: 45 mg/dL — ABNORMAL LOW (ref >=46)
LDL CALC: 99 mg/dL (ref <130)
TRIGLYCERIDES: 118 mg/dL (ref <150)
Total CHOL/HDL Ratio: 3.7 Ratio (ref <=5.0)
VLDL: 24 mg/dL (ref <30)

## 2016-01-19 LAB — POCT URINALYSIS DIPSTICK
Bilirubin, UA: NEGATIVE
Blood, UA: NEGATIVE
Glucose, UA: NEGATIVE
Ketones, UA: NEGATIVE
LEUKOCYTES UA: NEGATIVE
NITRITE UA: NEGATIVE
PROTEIN UA: NEGATIVE
Urobilinogen, UA: NEGATIVE
pH, UA: 5

## 2016-01-19 LAB — HEPATITIS C ANTIBODY: HCV Ab: NEGATIVE

## 2016-01-19 LAB — C-REACTIVE PROTEIN: CRP: 1.8 mg/dL — ABNORMAL HIGH

## 2016-01-19 NOTE — Progress Notes (Signed)
Patient ID: Jillian Steele, female   DOB: [birth year], 55 y.o.   MRN: [id number]  55 y.o. G3P2002 Married  Caucasian Fe here for annual exam.  No further problems with BP.  She plans a follow up Mammo to be done this month.  She is having some dyspnea with exertion.  No real chest pain.  Denies palpitations, dyspnea with rest, pedal edema.  She would like to see cardiologist.  Patient's last menstrual period was 10/14/2010.          Sexually active: No.  The current method of family planning is Tubal/ post menopausal status.    Exercising: Yes.    some walking. Smoker:  no  Health Maintenance: Pap:01/16/15, Negative with neg HR HPV MMG:01/12/15 with Right Ultrasound 01/20/15; Bi-Rads 3: Probably Benign, 07/26/15 Right Ultrasound, Bi-Rads 3: Probably Benign, repeat mammogram in 6 months--Pt.has not scheduled follow up. Colonoscopy: 05/14/13, tubular adenoma with Dr.Brodie, repeat in 5 years TDaP: 05/15/07 Hep C: done today HIV: 01/16/15 Labs: Yes, HGB: 13.4, Urine Dip:Neg   reports that she has never smoked. She has never used smokeless tobacco. She reports that she does not drink alcohol or use illicit drugs.  Past Medical History  Diagnosis Date  . Anxiety   . Hypertension   . Depression   . Fibroid 11/2013    fundal - 2 1/2 cm sac mucosal  . Hormone replacement therapy (postmenopausal) 11/12/2011  . Abnormal Pap smear of cervix     hx cryotherapy to cervix in her early 20s--paps normal since    Past Surgical History  Procedure Laterality Date  . Eye surgery      Lasik  . Tubal ligation  1997  . Knee arthroscopy Left 1993  . Arm wound repair / closure      left    Current Outpatient Prescriptions  Medication Sig Dispense Refill  . ALPRAZolam (XANAX) 0.5 MG tablet Take 1 tablet (0.5 mg total) by mouth daily as needed for anxiety. 30 tablet 0  . calcium carbonate (TUMS EX) 750 MG chewable tablet Chew 1 tablet by mouth 2 (two) times daily.     No current facility-administered  medications for this visit.    Family History  Problem Relation Age of Onset  . Heart disease Mother   . Thyroid disease Mother   . Hypertension Mother   . Stroke Mother   . Heart disease Father   . Hypertension Father   . Emphysema Father   . Heart disease Sister   . Heart disease Brother 73    heart stents X 2  . Thyroid disease Brother   . Cancer Paternal Grandmother     lung cancer  . Cancer Paternal Grandfather     prostate cancer  . Prostate cancer Paternal Uncle   . Colon cancer Neg Hx   . Nephrolithiasis Daughter   . Breast cancer Maternal Aunt     x2  . Heart disease Brother     Rheumatic Heart Disease    ROS:  Pertinent items are noted in HPI.  Otherwise, a comprehensive ROS was negative.  Exam:   BP 128/82 mmHg  Pulse 60  Resp 20  Ht 5\' 5"  (1.651 m)  Wt 200 lb (90.719 kg)  BMI 33.28 kg/m2  LMP 10/14/2010 Height: 5\' 5"  (165.1 cm) Ht Readings from Last 3 Encounters:  01/19/16 5\' 5"  (1.651 m)  09/21/15 5\' 6"  (1.676 m)  06/04/15 5' 5.5" (1.664 m)    General appearance: alert, cooperative and appears stated age  Head: Normocephalic, without obvious abnormality, atraumatic Neck: no adenopathy, supple, symmetrical, trachea midline and thyroid normal to inspection and palpation Lungs: clear to auscultation bilaterally Breasts: normal appearance, no masses or tenderness Heart: regular rate and rhythm Abdomen: soft, non-tender; no masses,  no organomegaly Extremities: extremities normal, atraumatic, no cyanosis or edema Skin: Skin color, texture, turgor normal. No rashes or lesions Lymph nodes: Cervical, supraclavicular, and axillary nodes normal. No abnormal inguinal nodes palpated Neurologic: Grossly normal   Pelvic: External genitalia:  no lesions              Urethra:  normal appearing urethra with no masses, tenderness or lesions              Bartholin's and Skene's: normal                 Vagina: normal appearing vagina with normal color and  discharge, no lesions              Cervix: anteverted              Pap taken: No. Bimanual Exam:  Uterus:  normal size, contour, position, consistency, mobility, non-tender              Adnexa: no mass, fullness, tenderness               Rectovaginal: Confirms               Anus:  normal sphincter tone, no lesions  Chaperone present: no  A:  Well Woman with normal exam  Menopausal no HRT History of obesity Dyspnea with exertion  FMH:  Heart disease   P:   Reviewed health and wellness pertinent to exam  Pap smear as above  Mammogram is now due and will be scheduled this month  Will get cardio eval and follow  Will follow with labs  Counseled on breast self exam, mammography screening, adequate intake of calcium and vitamin D, diet and exercise, Kegel's exercises return annually or prn  An After Visit Summary was printed and given to the patient.

## 2016-01-19 NOTE — Patient Instructions (Signed)

## 2016-01-20 LAB — VITAMIN D 25 HYDROXY (VIT D DEFICIENCY, FRACTURES): Vit D, 25-Hydroxy: 23 ng/mL — ABNORMAL LOW (ref 30–100)

## 2016-01-21 NOTE — Progress Notes (Signed)
Reviewed personally.  M. Suzanne Sophya Vanblarcom, MD.  

## 2016-02-21 ENCOUNTER — Encounter: Payer: Self-pay | Admitting: Cardiovascular Disease

## 2016-02-21 ENCOUNTER — Ambulatory Visit (INDEPENDENT_AMBULATORY_CARE_PROVIDER_SITE_OTHER): Payer: 59 | Admitting: Cardiovascular Disease

## 2016-02-21 VITALS — BP 152/100 | HR 94 | Ht 65.0 in | Wt 201.0 lb

## 2016-02-21 DIAGNOSIS — R0602 Shortness of breath: Secondary | ICD-10-CM

## 2016-02-21 DIAGNOSIS — R9431 Abnormal electrocardiogram [ECG] [EKG]: Secondary | ICD-10-CM | POA: Diagnosis not present

## 2016-02-21 DIAGNOSIS — E669 Obesity, unspecified: Secondary | ICD-10-CM

## 2016-02-21 DIAGNOSIS — I10 Essential (primary) hypertension: Secondary | ICD-10-CM

## 2016-02-21 NOTE — Progress Notes (Signed)
Patient ID: Jillian Steele, female   DOB: 1961/01/04, 55 y.o.   MRN: [id number]    Cardiology Office Note    Date:  02/23/2016   ID:  Jillian Steele, DOB [birth year], MRN [id number]  PCP:  JEFFERY,[name], PA-C  Cardiologist:   Sanda Klein, MD   Chief Complaint  Patient presents with  . New Evaluation  . Shortness of Breath  . Chest Pain    pressure  . Dizziness  . Edema    legs    History of Present Illness:  Jillian Steele is a 55 y.o. female with complaints of dyspnea, an abnormal ECG and a strong family history of premature CAD and early death (brother with 2 stents in his 44s, father died suddenly at 2).  She is considering a weight loss exercise program, but is worried about severe exertional dyspnea.She tries to walk on lunch break with her colleagues, but can only walk for less than 5 minutes on level ground. She becomes severely dyspneic and if she pushes it, develops dizziness and nausea. She denies edema. She often becomes dyspneic showering, dressing, walking 100 feet. At times at night she feels like she is smothering and she might be describing orthopnea.   Dyspnea has been ongoing for at least 1 year and is not improving despite some progress with weight loss (5-6 lb lighter in last year). She went through menopause about 3 years ago.  She denies chest discomfort. She has left knee pain, not claudication. She denies focal neuro complaints.  In the past she was treated with lisinopril and her BP "got too low" and they were stopped. Her BP is markedly elevated today, but she reports normal values when tested at home. Indeed, one month ago BP was 128/82 in her gynecologist's office.    Past Medical History  Diagnosis Date  . Anxiety   . Hypertension   . Depression   . Fibroid 11/2013    fundal - 2 1/2 cm sac mucosal  . Hormone replacement therapy (postmenopausal) 11/12/2011  . Abnormal Pap smear of cervix     hx cryotherapy to cervix in her early 20s--paps normal since     Past Surgical History  Procedure Laterality Date  . Eye surgery      Lasik  . Tubal ligation  1997  . Knee arthroscopy Left 1993  . Arm wound repair / closure      left    Current Medications: Outpatient Prescriptions Prior to Visit  Medication Sig Dispense Refill  . ALPRAZolam (XANAX) 0.5 MG tablet Take 1 tablet (0.5 mg total) by mouth daily as needed for anxiety. 30 tablet 0  . calcium carbonate (TUMS EX) 750 MG chewable tablet Chew 1 tablet by mouth 2 (two) times daily.     No facility-administered medications prior to visit.     Allergies:   Review of patient's allergies indicates no known allergies.   Social History   Social History  . Marital Status: Married    Spouse Name: Legrand Como  . Number of Children: 2  . Years of Education: 12+   Occupational History  . [employer] engineer (Psychologist, forensic)     [employer]   Social History Main Topics  . Smoking status: Never Smoker   . Smokeless tobacco: Never Used  . Alcohol Use: No  . Drug Use: No  . Sexual Activity:    Partners: Male    Birth Control/ Protection: Post-menopausal, Surgical   Other Topics Concern  . None   Social  History Narrative   Lives with her husband and their dog, Duke.  Their daughters are grown and live independently. Minimal contact with her younger daughter after a disagreement 06/2012.     Family History:  The patient's family history includes Breast cancer in her maternal aunt; Cancer in her paternal grandfather and paternal grandmother; Emphysema in her father; Heart disease in her brother, father, mother, and sister; Heart disease (age of onset: 29) in her brother; Hypertension in her father and mother; Nephrolithiasis in her daughter; Prostate cancer in her paternal uncle; Stroke in her mother; Thyroid disease in her brother and mother. There is no history of Colon cancer.   ROS:   Please see the history of present illness.    ROS All other systems reviewed and are  negative.   PHYSICAL EXAM:   VS:  BP 152/100 mmHg  Pulse 94  Ht 5\' 5"  (1.651 m)  Wt 91.173 kg (201 lb)  BMI 33.45 kg/m2  LMP 10/14/2010   GEN: Well nourished, well developed, in no acute distress HEENT: normal Neck: no JVD, carotid bruits, or masses Cardiac: RRR; no murmurs, rubs, or gallops,no edema  Respiratory:  clear to auscultation bilaterally, normal work of breathing GI: soft, nontender, nondistended, + BS MS: no deformity or atrophy Skin: warm and dry, no rash Neuro:  Alert and Oriented x 3, Strength and sensation are intact Psych: euthymic mood, full affect  Wt Readings from Last 3 Encounters:  02/21/16 91.173 kg (201 lb)  01/19/16 90.719 kg (200 lb)  09/21/15 92.08 kg (203 lb)      Studies/Labs Reviewed:   EKG:  EKG is ordered today.  The ekg ordered today demonstrates NSR, poor R wave progression and septal lead T wave inversion.  Recent Labs: 01/19/2016: ALT 22; BUN 13; Creat 0.75; Hemoglobin 13.8; Platelets 247; Potassium 4.3; Sodium 139; TSH 2.74   Lipid Panel    Component Value Date/Time   CHOL 168 01/19/2016 1132   TRIG 118 01/19/2016 1132   HDL 45* 01/19/2016 1132   CHOLHDL 3.7 01/19/2016 1132   VLDL 24 01/19/2016 1132   [place] 99 01/19/2016 1132    Additional studies/ records that were reviewed today include:  Notes from PCP and Ob/gyn    ASSESSMENT:    1. Shortness of breath   2. Abnormal EKG   3. Mild obesity   4. Essential hypertension      PLAN:  In order of problems listed above:  1. Dyspnea:  Appears more severe than expected for obesity and deconditioning. Could be CHF or angina equivalent. Check treadmill test and echo and return to discuss results. May need cardiac cath. 2. ECG Changes are not specific, but could represent ischemia in the LAD territory 3. Weight loss efforts are commendable, but would delay more intense exercise until her cardiac workup is performed. 4. Remarkable high diastolic BP today, recently normal BP.  May be situational. Recheck at time of stress test.    Medication Adjustments/Labs and Tests Ordered: Current medicines are reviewed at length with the patient today.  Concerns regarding medicines are outlined above.  Medication changes, Labs and Tests ordered today are listed in the Patient Instructions below. Patient Instructions  Medication Instructions: Dr Sallyanne Kuster has recommended making the following medication changes: 1. START Aspirin 81 mg - take 1 tablet by mouth once daily  Labwork: NONE ORDERED  Testing/Procedures: 1. Echocardiogram - Your physician has requested that you have an echocardiogram. Echocardiography is a painless test that uses sound waves to  create images of your heart. It provides your doctor with information about the size and shape of your heart and how well your heart's chambers and valves are working. This procedure takes approximately one hour. There are no restrictions for this procedure.  2. Exercise Treadmill Stress test - Your physician has requested that you have an exercise tolerance test. For further information please visit [facility]uge[facility]iest[facility][email or web address]. Please also follow instruction sheet, as given.  Follow-up: Dr Sallyanne Kuster recommends that you schedule a follow-up appointment after your tests.  If you need a refill on your cardiac medications before your next appointment, please call your pharmacy.    Signed, Sanda Klein, MD  02/23/2016 9:09 AM    [facility] [place], [place], [place]  [address] Phone: [phone]; Fax: [phone]

## 2016-02-21 NOTE — Patient Instructions (Signed)
Medication Instructions: Dr Sallyanne Kuster has recommended making the following medication changes: 1. START Aspirin 81 mg - take 1 tablet by mouth once daily  Labwork: NONE ORDERED  Testing/Procedures: 1. Echocardiogram - Your physician has requested that you have an echocardiogram. Echocardiography is a painless test that uses sound waves to create images of your heart. It provides your doctor with information about the size and shape of your heart and how well your heart's chambers and valves are working. This procedure takes approximately one hour. There are no restrictions for this procedure.  2. Exercise Treadmill Stress test - Your physician has requested that you have an exercise tolerance test. For further information please visit [facility]iest[facility][email or web address]. Please also follow instruction sheet, as given.  Follow-up: Dr Sallyanne Kuster recommends that you schedule a follow-up appointment after your tests.  If you need a refill on your cardiac medications before your next appointment, please call your pharmacy.

## 2016-02-23 DIAGNOSIS — I10 Essential (primary) hypertension: Secondary | ICD-10-CM | POA: Insufficient documentation

## 2016-02-23 DIAGNOSIS — Z6832 Body mass index (BMI) 32.0-32.9, adult: Secondary | ICD-10-CM | POA: Insufficient documentation

## 2016-03-05 ENCOUNTER — Telehealth (HOSPITAL_COMMUNITY): Payer: Self-pay

## 2016-03-05 NOTE — Telephone Encounter (Signed)
Encounter complete. 

## 2016-03-07 ENCOUNTER — Ambulatory Visit (HOSPITAL_BASED_OUTPATIENT_CLINIC_OR_DEPARTMENT_OTHER)
Admission: RE | Admit: 2016-03-07 | Discharge: 2016-03-07 | Disposition: A | Discharge status: Home / Self Care | Payer: 59 | Source: Ambulatory Visit | Attending: Cardiology | Admitting: Cardiology

## 2016-03-07 ENCOUNTER — Ambulatory Visit (HOSPITAL_COMMUNITY)
Admission: RE | Admit: 2016-03-07 | Discharge: 2016-03-07 | Disposition: A | Discharge status: Home / Self Care | Payer: 59 | Source: Ambulatory Visit | Attending: Cardiovascular Disease | Admitting: Cardiovascular Disease

## 2016-03-07 DIAGNOSIS — R9431 Abnormal electrocardiogram [ECG] [EKG]: Secondary | ICD-10-CM

## 2016-03-07 DIAGNOSIS — I10 Essential (primary) hypertension: Secondary | ICD-10-CM | POA: Insufficient documentation

## 2016-03-07 DIAGNOSIS — R0602 Shortness of breath: Secondary | ICD-10-CM | POA: Diagnosis not present

## 2016-03-07 LAB — EXERCISE TOLERANCE TEST
CHL CUP MPHR: 165 {beats}/min
CHL CUP RESTING HR STRESS: 85 {beats}/min
CHL RATE OF PERCEIVED EXERTION: 17
CSEPEDS: 1 s
CSEPPHR: 157 {beats}/min
Estimated workload: 4.6 METS
Exercise duration (min): 3 min
Percent HR: 95 %

## 2016-03-12 ENCOUNTER — Telehealth: Payer: Self-pay

## 2016-03-12 DIAGNOSIS — R0602 Shortness of breath: Secondary | ICD-10-CM

## 2016-03-12 MED ORDER — AMLODIPINE BESYLATE 2.5 MG PO TABS: 2.5000 mg | ORAL_TABLET | Freq: Every day | ORAL | Status: DC

## 2016-03-12 NOTE — Telephone Encounter (Signed)
-----   Message from Sanda Klein, MD sent at 03/07/2016  5:37 PM EDT ----- Both the echo and the stress test were normal. However, her exercise capacity on the stress test was clearly very limited and her BP was high.  I think the symptoms may be due to a combination of deconditioning and untreated HTN. I think it's OK to start exercising, but will need to start at a very low workload and increase slowly. Also, I think she needs BP meds.  Recommend we try amlodipine 2.5 mg daily to start.  For completeness, I think it is reasonable to also have PFTs and we can schedule those before her follow up.

## 2016-03-12 NOTE — Telephone Encounter (Signed)
Called patient with results and recommendations.  Amlodipine 2.5 mg QD sent to patient preferred pharmacy - [facility]. PFTS to be ordered at [facility]. Patient made aware and is agreeable to this. Notified patient that a scheduler will be in contact to set up PFTs. Patient verbalized understanding and agreed with plan.

## 2016-03-20 ENCOUNTER — Ambulatory Visit (HOSPITAL_COMMUNITY)
Admission: RE | Admit: 2016-03-20 | Discharge: 2016-03-20 | Disposition: A | Discharge status: Home / Self Care | Payer: 59 | Source: Ambulatory Visit | Attending: Cardiovascular Disease | Admitting: Cardiovascular Disease

## 2016-03-20 DIAGNOSIS — R0602 Shortness of breath: Secondary | ICD-10-CM

## 2016-03-20 LAB — PULMONARY FUNCTION TEST
DL/VA % pred: 81 %
DL/VA: 4.01 ml/min/mmHg/L
DLCO unc % pred: 77 %
DLCO unc: 19.88 ml/min/mmHg
FEF 25-75 POST: 2.87 L/s
FEF 25-75 Pre: 2.44 L/sec
FEF2575-%CHANGE-POST: 17 %
FEF2575-%Pred-Post: 110 %
FEF2575-%Pred-Pre: 93 %
FEV1-%Change-Post: 2 %
FEV1-%PRED-PRE: 100 %
FEV1-%Pred-Post: 103 %
FEV1-PRE: 2.79 L
FEV1-Post: 2.88 L
FEV1FVC-%CHANGE-POST: 2 %
FEV1FVC-%PRED-PRE: 99 %
FEV6-%Change-Post: 0 %
FEV6-%PRED-PRE: 103 %
FEV6-%Pred-Post: 103 %
FEV6-Post: 3.56 L
FEV6-Pre: 3.56 L
FEV6FVC-%Change-Post: 0 %
FEV6FVC-%PRED-POST: 102 %
FEV6FVC-%Pred-Pre: 102 %
FVC-%CHANGE-POST: 0 %
FVC-%PRED-POST: 101 %
FVC-%PRED-PRE: 100 %
FVC-POST: 3.58 L
FVC-Pre: 3.57 L
POST FEV6/FVC RATIO: 100 %
PRE FEV6/FVC RATIO: 100 %
Post FEV1/FVC ratio: 80 %
Pre FEV1/FVC ratio: 78 %
RV % pred: 124 %
RV: 2.43 L
TLC % PRED: 113 %
TLC: 5.88 L

## 2016-03-20 MED ORDER — ALBUTEROL SULFATE (2.5 MG/3ML) 0.083% IN NEBU
2.5000 mg | INHALATION_SOLUTION | Freq: Once | RESPIRATORY_TRACT | Status: AC
  Administered 2016-03-20: 2.5 mg via RESPIRATORY_TRACT

## 2016-03-26 ENCOUNTER — Ambulatory Visit (INDEPENDENT_AMBULATORY_CARE_PROVIDER_SITE_OTHER): Payer: 59 | Admitting: Physician Assistant

## 2016-03-26 ENCOUNTER — Encounter: Payer: Self-pay | Admitting: Physician Assistant

## 2016-03-26 VITALS — BP 128/76 | HR 96 | Temp 98.1°F | Resp 16 | Ht 65.0 in | Wt 200.8 lb

## 2016-03-26 DIAGNOSIS — I10 Essential (primary) hypertension: Secondary | ICD-10-CM

## 2016-03-26 DIAGNOSIS — F329 Major depressive disorder, single episode, unspecified: Secondary | ICD-10-CM

## 2016-03-26 DIAGNOSIS — R0602 Shortness of breath: Secondary | ICD-10-CM

## 2016-03-26 DIAGNOSIS — F32A Depression, unspecified: Secondary | ICD-10-CM

## 2016-03-26 MED ORDER — ALPRAZOLAM 0.5 MG PO TABS: 0.5000 mg | ORAL_TABLET | Freq: Every day | ORAL | Status: DC | PRN

## 2016-03-26 MED ORDER — BUPROPION HCL ER (XL) 150 MG PO TB24: 150.0000 mg | ORAL_TABLET | Freq: Every day | ORAL | Status: DC

## 2016-03-26 NOTE — Progress Notes (Signed)
Patient ID: Jillian Steele, female    DOB: 1961/03/05, 55 y.o.   MRN: [id number]  PCP: Wynne Dust  Subjective:   Chief Complaint  Patient presents with  . Follow-up    BLOOD PRESSURE  . Depression    answers positive in triage    HPI Presents for evaluation of depression.  "It's been tough." Under a lot of stress. Progressively worsening over the past year.  Daughter had a uterine fibroid removed (10 cm) and has recovered. She had no insurance at the time of her diagnosis. The patient has had to pay for the evaluation and treatment.  At her annual well-woman exam, her GYN asked why she wasn't exercising, and she reported that she was experiencing shortness of breath. She was sent for cardiology evaluation. EKG was "abnormal," heart rate was high, BP was elevated. Stress test and ECHO were normal. She was started back on blood pressure medication, amlodipine 2.5 mg. PFTs were normal. Last week after the pulmonary function testing, her arm got numb, she felt like she might faint. So far, no cause found for shortness of breath. "I don't feel right." Checking her BP at the drug store every 3-4 days, notes it remains high and her heart rate goes up. Follow-up with Cardiology 6/16.   February, her sister in [place] became ill.  01/2016 the sister's condition deteriorated and the patient went there emergently with her brother x 1 week. Used vacation time. The following week, she went on planned 1 week vacation to [place] with her family. Upon her return, her sister's condition further deteriorated, and she went back and sat with her until her death. When she returned to work, she was written up for failure to manage her PTO. She received bereavement time off (2 days) upon the sister's death, but 3 days prior to that were taken unpaid, because she'd exhausted her PTO and her sister hadn't died yet.  12 hour shifts at work. "I can't do it." Works in a very hot  environment. 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 Doing this is when she first noticed the SOB.  While she doesn't have a regular exercise plan/regimen, she is relatively active at work.  Previously on Lexapro and Wellbutrin. Thinks she needs to resume it.   Review of Systems As above.    Patient Active Problem List   Diagnosis Date Noted  . Mild obesity 02/23/2016  . High blood pressure 02/23/2016  . Hypertension 11/12/2011  . Anxiety 11/12/2011     Prior to Admission medications   Medication Sig Start Date End Date Taking? Authorizing Provider  ALPRAZolam Duanne Moron) 0.5 MG tablet Take 1 tablet (0.5 mg total) by mouth daily as needed for anxiety. 01/03/15  Yes Venkat Ankney, PA-C  amLODipine (NORVASC) 2.5 MG tablet Take 1 tablet (2.5 mg total) by mouth daily. 03/12/16  Yes Mihai Croitoru, MD  calcium carbonate (TUMS EX) 750 MG chewable tablet Chew 1 tablet by mouth 2 (two) times daily.   Yes Historical Provider, MD     No Known Allergies     Objective:  Physical Exam  Constitutional: She is oriented to person, place, and time. She appears well-developed and well-nourished. She is active and cooperative. No distress.  BP 128/76 mmHg  Pulse 96  Temp(Src) 98.1 F (36.7 C) (Oral)  Resp 16  Ht 5\' 5"  (1.651 m)  Wt 200 lb 12.8 oz (91.082 kg)  BMI 33.41 kg/m2  SpO2 93%  LMP 10/14/2010  HENT:  Head: Normocephalic and atraumatic.  Right Ear: Hearing normal.  Left Ear: Hearing normal.  Eyes: Conjunctivae are normal. No scleral icterus.  Neck: Normal range of motion. Neck supple. No thyromegaly present.  Cardiovascular: Normal rate, regular rhythm and normal heart sounds.   Pulses:      Radial pulses are 2+ on the right side, and 2+ on the left side.  Pulmonary/Chest: Effort normal and breath sounds normal.  Lymphadenopathy:       Head (right side): No tonsillar, no preauricular, no posterior auricular and no occipital adenopathy present.        Head (left side): No tonsillar, no preauricular, no posterior auricular and no occipital adenopathy present.    She has no cervical adenopathy.       Right: No supraclavicular adenopathy present.       Left: No supraclavicular adenopathy present.  Neurological: She is alert and oriented to person, place, and time. No sensory deficit.  Skin: Skin is warm, dry and intact. No rash noted. No cyanosis or erythema. Nails show no clubbing.  Psychiatric: Her speech is normal and behavior is normal. Judgment and thought content normal. Her mood appears not anxious. Her affect is not angry, not blunt, not labile and not inappropriate. Cognition and memory are normal. She exhibits a depressed mood (tearful).           Assessment & Plan:   1. Essential hypertension Continue management with cardiology.  2. Depression Restart wellbutrin. PRN alprazolam.  - buPROPion (WELLBUTRIN XL) 150 MG 24 hr tablet; Take 1 tablet (150 mg total) by mouth daily.  Dispense: 30 tablet; Refill: 3 - ALPRAZolam (XANAX) 0.5 MG tablet; Take 1 tablet (0.5 mg total) by mouth daily as needed for anxiety.  Dispense: 30 tablet; Refill: 0  3. Shortness of breath Possibly due to uncontrolled HTN, deconditioning, but the patient believes that it is due to her stress, and uncontrolled depression.   Return in about 2 weeks (around 04/09/2016) for re-evaluation of mood and blood pressure and shortness of breath.    Fara Chute, PA-C Physician Assistant-Certified [facility]

## 2016-03-26 NOTE — Patient Instructions (Addendum)
     IF you received an x-ray today, you will receive an invoice from [facility]. Please contact [facility] at [phone] with questions or concerns regarding your invoice.   IF you received labwork today, you will receive an invoice from [facility]. Please contact [facility] at [phone] with questions or concerns regarding your invoice.   Our billing staff will not be able to assist you with questions regarding bills from these companies.  You will be contacted with the lab results as soon as they are available. The fastest way to get your results is to activate your My Chart account. Instructions are located on the last page of this paperwork. If you have not heard from us regarding the results in 2 weeks, please contact this office.     We recommend that you schedule a mammogram for breast cancer screening. Typically, you do not need a referral to do this. Please contact a local imaging center to schedule your mammogram.  [facility] - [phone]  *ask for the Radiology Department [facility]) - [phone] or [phone]  [facility] - [phone] [facility] - [phone] [facility] - [phone]  *ask for the Radiology Department [facility] - [phone]  *ask for the Radiology Department [facility] - [phone]  *ask for the Mammography Department [facility] - [phone]  

## 2016-03-29 ENCOUNTER — Encounter: Payer: Self-pay | Admitting: Cardiovascular Disease

## 2016-03-29 ENCOUNTER — Telehealth: Payer: Self-pay

## 2016-03-29 ENCOUNTER — Ambulatory Visit (INDEPENDENT_AMBULATORY_CARE_PROVIDER_SITE_OTHER): Payer: 59 | Admitting: Cardiovascular Disease

## 2016-03-29 VITALS — BP 144/82 | HR 88 | Ht 65.0 in | Wt 201.0 lb

## 2016-03-29 DIAGNOSIS — I10 Essential (primary) hypertension: Secondary | ICD-10-CM

## 2016-03-29 DIAGNOSIS — E669 Obesity, unspecified: Secondary | ICD-10-CM | POA: Diagnosis not present

## 2016-03-29 DIAGNOSIS — R0602 Shortness of breath: Secondary | ICD-10-CM

## 2016-03-29 MED ORDER — AMLODIPINE BESYLATE 5 MG PO TABS: 5.0000 mg | ORAL_TABLET | Freq: Every day | ORAL | Status: DC

## 2016-03-29 NOTE — Telephone Encounter (Signed)
Patient wants [place] to call once we receive the fax for FMLA. She states that it was faxed on Wednesday 6/14. [phone]

## 2016-03-29 NOTE — Progress Notes (Signed)
Patient ID: Jillian Steele, female   DOB: 1961/05/30, 55 y.o.   MRN: [id number]    Cardiology Office Note    Date:  03/29/2016   ID:  Jillian Steele, DOB [birth year], MRN [id number]  PCP:  JEFFERY,[name], PA-C  Cardiologist:   Sanda Klein, MD   Chief Complaint  Patient presents with  . Chest Pain    pt states no chest pain  . Shortness of Breath    some SOB when doing things     History of Present Illness:  Jillian Steele is a 55 y.o. female with complaints of dyspnea, an abnormal ECG and a strong family history of premature CAD and early death (brother with 2 stents in his 36s, father died suddenly at 65).  She is considering a weight loss exercise program, but is worried about severe exertional dyspnea.She tries to walk on lunch break with her colleagues, but can only walk for less than 5 minutes on level ground. She becomes severely dyspneic and if she pushes it, develops dizziness and nausea. She denies edema. She often becomes dyspneic showering, dressing, walking 100 feet. At times at night she feels like she is smothering and she might be describing orthopnea.   Dyspnea has been ongoing for at least 1 year and is not improving despite some progress with weight loss (5-6 lb lighter in last year). She went through menopause about 3 years ago.  She denies chest discomfort. She has left knee pain, not claudication. She denies focal neuro complaints.  In the past she was treated with lisinopril and her BP "got too low" and they were stopped. Her BP is markedly elevated today, but she reports normal values when tested at home.  However, when she came for a treadmill stress test in our office she developed severe hypertension with exercise. She was only able to walk for just over 3 minutes. There were no ECG changes at rest or with exercise. Her echocardiogram showed reassuring findings with normal left ventricular systolic function although there was evidence of mild diastolic dysfunction,  without signs of elevated filling pressure. Pulmonary function tests were completely normal. We started treatment with amlodipine 2.5 mg after those tests were performed.  Sadly, her sister passed away just 2 weeks after being diagnosed with multiple myeloma.    Past Medical History  Diagnosis Date  . Anxiety   . Hypertension   . Depression   . Fibroid 11/2013    fundal - 2 1/2 cm sac mucosal  . Hormone replacement therapy (postmenopausal) 11/12/2011  . Abnormal Pap smear of cervix     hx cryotherapy to cervix in her early 20s--paps normal since    Past Surgical History  Procedure Laterality Date  . Eye surgery      Lasik  . Tubal ligation  1997  . Knee arthroscopy Left 1993  . Arm wound repair / closure      left    Current Medications: Outpatient Prescriptions Prior to Visit  Medication Sig Dispense Refill  . ALPRAZolam (XANAX) 0.5 MG tablet Take 1 tablet (0.5 mg total) by mouth daily as needed for anxiety. 30 tablet 0  . buPROPion (WELLBUTRIN XL) 150 MG 24 hr tablet Take 1 tablet (150 mg total) by mouth daily. 30 tablet 3  . calcium carbonate (TUMS EX) 750 MG chewable tablet Chew 1 tablet by mouth 2 (two) times daily.    Marland Kitchen amLODipine (NORVASC) 2.5 MG tablet Take 1 tablet (2.5 mg total) by mouth daily. 90 tablet 3  No facility-administered medications prior to visit.     Allergies:   Review of patient's allergies indicates no known allergies.   Social History   Social History  . Marital Status: Married    Spouse Name: Legrand Como  . Number of Children: 2  . Years of Education: 12+   Occupational History  . [employer] engineer (Psychologist, forensic)     [employer]   Social History Main Topics  . Smoking status: Never Smoker   . Smokeless tobacco: Never Used  . Alcohol Use: No  . Drug Use: No  . Sexual Activity:    Partners: Male    Birth Control/ Protection: Post-menopausal, Surgical   Other Topics Concern  . None   Social History Narrative   Lives  with her husband and their dog, Duke.  Their daughters are grown and live independently. Minimal contact with her younger daughter after a disagreement 06/2012.     Family History:  The patient's family history includes Breast cancer in her maternal aunt; Cancer in her paternal grandfather and paternal grandmother; Emphysema in her father; Heart disease in her brother, father, mother, and sister; Heart disease (age of onset: 62) in her brother; Hypertension in her father and mother; Multiple myeloma in her sister; Nephrolithiasis in her daughter; Prostate cancer in her paternal uncle; Stroke in her mother; Thyroid disease in her brother and mother. There is no history of Colon cancer.   ROS:   Please see the history of present illness.    ROS All other systems reviewed and are negative.   PHYSICAL EXAM:   VS:  BP 144/82 mmHg  Pulse 88  Ht '5\' 5"'  (1.651 m)  Wt 91.173 kg (201 lb)  BMI 33.45 kg/m2  SpO2 95%  LMP 10/14/2010   GEN: Well nourished, well developed, in no acute distress HEENT: normal Neck: no JVD, carotid bruits, or masses Cardiac: RRR; no murmurs, rubs, or gallops,no edema  Respiratory:  clear to auscultation bilaterally, normal work of breathing GI: soft, nontender, nondistended, + BS MS: no deformity or atrophy Skin: warm and dry, no rash Neuro:  Alert and Oriented x 3, Strength and sensation are intact Psych: euthymic mood, full affect  Wt Readings from Last 3 Encounters:  03/29/16 91.173 kg (201 lb)  03/26/16 91.082 kg (200 lb 12.8 oz)  02/21/16 91.173 kg (201 lb)      Studies/Labs Reviewed:   EKG:  EKG is not ordered today.  The ekg ordered At her previous appointment demonstrates NSR, poor R wave progression and septal lead T wave inversion.  Recent Labs: 01/19/2016: ALT 22; BUN 13; Creat 0.75; Hemoglobin 13.8; Platelets 247; Potassium 4.3; Sodium 139; TSH 2.74   Lipid Panel    Component Value Date/Time   CHOL 168 01/19/2016 1132   TRIG 118 01/19/2016 1132     HDL 45* 01/19/2016 1132   CHOLHDL 3.7 01/19/2016 1132   VLDL 24 01/19/2016 1132   [place] 99 01/19/2016 1132     ASSESSMENT:    No diagnosis found.   PLAN:  In order of problems listed above:  1. Dyspnea:  Appears more severe than expected for obesity and deconditioning. Echo and treadmill stress test did not show overt evidence of structural heart disease, but her exercise capacity is indeed very poor. It may be that she has severe exercise-induced hypertension. If treatment of her blood pressure does not lead to improvement of symptoms, it is probably worthwhile to pursue right and left heart catheterization. She does not have  angina pectoris, but does have subtle EKG changes that could represent ischemia in the LAD artery distribution. She has a compelling family history of premature CAD. 2. HTN: Variably elevated blood pressure at rest, severe exercise-induced hypertension. Increase amlodipine to 5 mg daily. 3. Obesity and deconditioning are likely contribution to her dyspnea, but normal pulmonary function test   Medication Adjustments/Labs and Tests Ordered: Current medicines are reviewed at length with the patient today.  Concerns regarding medicines are outlined above.  Medication changes, Labs and Tests ordered today are listed in the Patient Instructions below. Patient Instructions  Dr Sallyanne Kuster has recommended making the following medication changes: 1. INCREASE Amlodipine to 5 mg daily  Dr Sallyanne Kuster recommends that you schedule a follow-up appointment in 6-8 weeks.  If you need a refill on your cardiac medications before your next appointment, please call your pharmacy.     Signed, Sanda Klein, MD  03/29/2016 8:16 PM    [facility] [address], [place], [place]  [address] Phone: [phone]; Fax: [phone]

## 2016-03-29 NOTE — Patient Instructions (Signed)
Dr Sallyanne Kuster has recommended making the following medication changes: 1. INCREASE Amlodipine to 5 mg daily  Dr Sallyanne Kuster recommends that you schedule a follow-up appointment in 6-8 weeks.  If you need a refill on your cardiac medications before your next appointment, please call your pharmacy.

## 2016-03-30 ENCOUNTER — Encounter: Payer: Self-pay | Admitting: Physician Assistant

## 2016-04-08 ENCOUNTER — Ambulatory Visit (INDEPENDENT_AMBULATORY_CARE_PROVIDER_SITE_OTHER): Payer: 59 | Admitting: Physician Assistant

## 2016-04-08 VITALS — BP 122/72 | HR 83 | Temp 98.5°F | Resp 17 | Ht 65.5 in | Wt 200.0 lb

## 2016-04-08 DIAGNOSIS — F32A Depression, unspecified: Secondary | ICD-10-CM

## 2016-04-08 DIAGNOSIS — F329 Major depressive disorder, single episode, unspecified: Secondary | ICD-10-CM

## 2016-04-08 DIAGNOSIS — F419 Anxiety disorder, unspecified: Secondary | ICD-10-CM | POA: Diagnosis not present

## 2016-04-08 MED ORDER — ESCITALOPRAM OXALATE 20 MG PO TABS: 20.0000 mg | ORAL_TABLET | Freq: Every day | ORAL | Status: DC

## 2016-04-08 NOTE — Progress Notes (Signed)
Patient ID: Jillian Steele, female    DOB: 20-Oct-1960, 55 y.o.   MRN: [id number]  PCP: Harrison Mons, PA-C  Subjective:   Chief Complaint  Patient presents with  . Follow-up    Med check per patient   . Depression    HPI Presents for evaluation of depression and anxiety.  I saw her last week and restarted Wellbutrin (she'd previously been on it and Lexapro), in hopes to increase her energy.  Cardiologist increased amlodipine from 2.5 mg to 5 mg. Feeling better that she's out of work, but stressing about not being there and the propesct of going back. Feeling disappointed and hurt. They work 12 hour shifts, even if there is nothing to do. Doesn't feel in a place where she can be trained to do multiple other jobs to keep her busy. Doesn't want to be social. Madaline Savage duty tomorrow, so won't be able to keep her appointment.  The first few days on the Wellbutrin she felt panicky. Alprazolam helped.  Still not sleeping. No desire to go relax in the pool. Staying in the house watching TV and holding her puppy.  Was able to get in with her therapist. Sees her again tonight. ?Add back the Lexapro?   Depression screen [facility] 2/9 04/08/2016 03/26/2016 09/21/2015 06/04/2015 02/03/2014  Decreased Interest 2 3 0 0 0  Down, Depressed, Hopeless 2 3 0 0 0  PHQ - 2 Score 4 6 0 0 0  Altered sleeping 2 1 - - -  Tired, decreased energy 2 3 - - -  Change in appetite 2 3 - - -  Feeling bad or failure about yourself  0 2 - - -  Trouble concentrating 2 3 - - -  Moving slowly or fidgety/restless 2 1 - - -  Suicidal thoughts 0 0 - - -  PHQ-9 Score 14 19 - - -  Difficult doing work/chores Somewhat difficult - - - -       Review of Systems  Constitutional: Positive for appetite change and fatigue.  Respiratory: Negative for chest tightness and shortness of breath.   Cardiovascular: Negative for chest pain, palpitations and leg swelling.  Psychiatric/Behavioral: Positive for sleep disturbance,  dysphoric mood and decreased concentration. Negative for suicidal ideas and self-injury. The patient is nervous/anxious.        Patient Active Problem List   Diagnosis Date Noted  . Shortness of breath 03/26/2016  . Depression 03/26/2016  . Mild obesity 02/23/2016  . High blood pressure 02/23/2016  . Anxiety 11/12/2011     Prior to Admission medications   Medication Sig Start Date End Date Taking? Authorizing Provider  ALPRAZolam Duanne Moron) 0.5 MG tablet Take 1 tablet (0.5 mg total) by mouth daily as needed for anxiety. 03/26/16  Yes Ayriel Texidor, PA-C  amLODipine (NORVASC) 5 MG tablet Take 1 tablet (5 mg total) by mouth daily. 03/29/16  Yes Mihai Croitoru, MD  buPROPion (WELLBUTRIN XL) 150 MG 24 hr tablet Take 1 tablet (150 mg total) by mouth daily. 03/26/16  Yes Nester Bachus, PA-C  calcium carbonate (TUMS EX) 750 MG chewable tablet Chew 1 tablet by mouth 2 (two) times daily.   Yes Historical Provider, MD     No Known Allergies     Objective:  Physical Exam  Constitutional: She is oriented to person, place, and time. She appears well-developed and well-nourished. She is active and cooperative. No distress.  BP 122/72 mmHg  Pulse 83  Temp(Src) 98.5 F (36.9 C) (Oral)  Resp 17  Ht 5' 5.5" (1.664 m)  Wt 200 lb (90.719 kg)  BMI 32.76 kg/m2  SpO2 98%  LMP 10/14/2010  HENT:  Head: Normocephalic and atraumatic.  Right Ear: Hearing normal.  Left Ear: Hearing normal.  Eyes: Conjunctivae are normal. No scleral icterus.  Neck: Normal range of motion. Neck supple. No thyromegaly present.  Cardiovascular: Normal rate, regular rhythm and normal heart sounds.   Pulses:      Radial pulses are 2+ on the right side, and 2+ on the left side.  Pulmonary/Chest: Effort normal and breath sounds normal.  Lymphadenopathy:       Head (right side): No tonsillar, no preauricular, no posterior auricular and no occipital adenopathy present.       Head (left side): No tonsillar, no preauricular,  no posterior auricular and no occipital adenopathy present.    She has no cervical adenopathy.       Right: No supraclavicular adenopathy present.       Left: No supraclavicular adenopathy present.  Neurological: She is alert and oriented to person, place, and time. No sensory deficit.  Skin: Skin is warm, dry and intact. No rash noted. No cyanosis or erythema. Nails show no clubbing.  Psychiatric: Her speech is normal and behavior is normal. Judgment and thought content normal. Her affect is angry and labile (tearful). Cognition and memory are normal. She exhibits a depressed mood.           Assessment & Plan:   1. Depression 2. Anxiety Continue Wellbutrin. Add Lexapro. Continue with therapy. Plan leave of absence from work, initially for 1 month. Beginning today. We apparently have the FMLA papers, just haven't completed them. Reassess her ability to RTW in 3-4 weeks. - escitalopram (LEXAPRO) 20 MG tablet; Take 1 tablet (20 mg total) by mouth daily.  Dispense: 30 tablet; Refill: 3   Fara Chute, PA-C Physician Assistant-Certified [facility]

## 2016-04-08 NOTE — Telephone Encounter (Signed)
Paperwork received via email on 04/04/16 I have highlighted the areas that need to be completed and I will place them in [name]'s box on 04/08/16, if you could please complete this form and return it to the FMLA/Disability box at the 102 checkout desk within 5-7 business days. Thank you!

## 2016-04-08 NOTE — Patient Instructions (Signed)
     IF you received an x-ray today, you will receive an invoice from [facility]. Please contact [facility] at [phone] with questions or concerns regarding your invoice.   IF you received labwork today, you will receive an invoice from [facility]. Please contact [facility] at [phone] with questions or concerns regarding your invoice.   Our billing staff will not be able to assist you with questions regarding bills from these companies.  You will be contacted with the lab results as soon as they are available. The fastest way to get your results is to activate your My Chart account. Instructions are located on the last page of this paperwork. If you have not heard from us regarding the results in 2 weeks, please contact this office.      

## 2016-04-09 ENCOUNTER — Ambulatory Visit: Payer: 59 | Admitting: Physician Assistant

## 2016-04-09 NOTE — Telephone Encounter (Signed)
Completed at 104. Will bring to 102 after clinic.

## 2016-04-10 ENCOUNTER — Telehealth: Payer: Self-pay | Admitting: *Deleted

## 2016-04-10 NOTE — Telephone Encounter (Signed)
Paperwork scanned and faxed to [employer] as well as call the patient to come get a copy on 04/10/16

## 2016-04-10 NOTE — Telephone Encounter (Signed)
Please contact patient in regards to 04 recall. Patient was due 02/2016 for follow up mammogram at [facility].  Thanks Margaretha Sheffield

## 2016-04-12 NOTE — Telephone Encounter (Signed)
I spoke to the patient to remind her of need for mammogram.  Patient states she will schedule.  Declined offer to help schedule as she is driving and unable to see calendar.  Patient will call to schedule.  Please advise recall.

## 2016-04-12 NOTE — Telephone Encounter (Signed)
Please change MMG recall to July, 2017.  We can check next month to see if she went.  Thanks.

## 2016-04-15 NOTE — Telephone Encounter (Signed)
Moved MMG recall to July 2017.

## 2016-04-23 ENCOUNTER — Ambulatory Visit (INDEPENDENT_AMBULATORY_CARE_PROVIDER_SITE_OTHER): Payer: 59 | Admitting: Physician Assistant

## 2016-04-23 ENCOUNTER — Encounter: Payer: Self-pay | Admitting: Physician Assistant

## 2016-04-23 VITALS — BP 134/76 | HR 86 | Temp 98.3°F | Resp 16 | Ht 65.0 in | Wt 199.8 lb

## 2016-04-23 DIAGNOSIS — F419 Anxiety disorder, unspecified: Secondary | ICD-10-CM

## 2016-04-23 DIAGNOSIS — F32A Depression, unspecified: Secondary | ICD-10-CM

## 2016-04-23 DIAGNOSIS — F329 Major depressive disorder, single episode, unspecified: Secondary | ICD-10-CM | POA: Diagnosis not present

## 2016-04-23 NOTE — Patient Instructions (Addendum)
Touch base with me in two weeks and let me know how you are doing. If we need to consider intermittent leave, we can do that.    IF you received an x-ray today, you will receive an invoice from [facility]. Please contact [facility] at [phone] with questions or concerns regarding your invoice.   IF you received labwork today, you will receive an invoice from [employer]. Please contact [facility] at [phone] with questions or concerns regarding your invoice.   Our billing staff will not be able to assist you with questions regarding bills from these companies.  You will be contacted with the lab results as soon as they are available. The fastest way to get your results is to activate your My Chart account. Instructions are located on the last page of this paperwork. If you have not heard from [place] regarding the results in 2 weeks, please contact this office.

## 2016-04-23 NOTE — Progress Notes (Signed)
Patient ID: Jillian Steele, female    DOB: May 24, 1961, 55 y.o.   MRN: [id number]  PCP: Harrison Mons, PA-C  Subjective:   Chief Complaint  Patient presents with  . follow up on leave of absence    HPI Presents for evaluation of depression/anxiety.  She has been out of work now since 03/26/2016. More ready to go back to work. Not really sure she is really ready. "I don't know what will happen when I get there." "I'm going to get all worked up again in my head. And angry." "I'll go take another Xanax." If she needs to, she'll start looking for another job, but she needs this one until she finds a new one. "Unless it's unbearable."   Review of Systems As above. No CP, SOB, HA, dizziness.   Depression screen [facility] 2/9 04/23/2016 04/08/2016 03/26/2016 09/21/2015 06/04/2015  Decreased Interest 2 2 3  0 0  Down, Depressed, Hopeless 2 2 3  0 0  PHQ - 2 Score 4 4 6  0 0  Altered sleeping 1 2 1  - -  Tired, decreased energy 0 2 3 - -  Change in appetite 1 2 3  - -  Feeling bad or failure about yourself  0 0 2 - -  Trouble concentrating 0 2 3 - -  Moving slowly or fidgety/restless 0 2 1 - -  Suicidal thoughts 0 0 0 - -  PHQ-9 Score 6 14 19  - -  Difficult doing work/chores - Somewhat difficult - - -      Patient Active Problem List   Diagnosis Date Noted  . Shortness of breath 03/26/2016  . Depression 03/26/2016  . Mild obesity 02/23/2016  . High blood pressure 02/23/2016  . Anxiety 11/12/2011     Prior to Admission medications   Medication Sig Start Date End Date Taking? Authorizing Provider  ALPRAZolam Duanne Moron) 0.5 MG tablet Take 1 tablet (0.5 mg total) by mouth daily as needed for anxiety. 03/26/16  Yes Ylianna Almanzar, PA-C  amLODipine (NORVASC) 5 MG tablet Take 1 tablet (5 mg total) by mouth daily. 03/29/16  Yes Mihai Croitoru, MD  buPROPion (WELLBUTRIN XL) 150 MG 24 hr tablet Take 1 tablet (150 mg total) by mouth daily. 03/26/16  Yes Cederic Mozley, PA-C  calcium carbonate (TUMS  EX) 750 MG chewable tablet Chew 1 tablet by mouth 2 (two) times daily.   Yes Historical Provider, MD  escitalopram (LEXAPRO) 20 MG tablet Take 1 tablet (20 mg total) by mouth daily. 04/08/16  Yes Jerilee Space, PA-C     No Known Allergies     Objective:  Physical Exam  Constitutional: She is oriented to person, place, and time. She appears well-developed and well-nourished. She is active and cooperative. No distress.  BP 134/76 mmHg  Pulse 86  Temp(Src) 98.3 F (36.8 C) (Oral)  Resp 16  Ht 5\' 5"  (1.651 m)  Wt 199 lb 12.8 oz (90.629 kg)  BMI 33.25 kg/m2  SpO2 97%  LMP 10/14/2010   Eyes: Conjunctivae are normal.  Pulmonary/Chest: Effort normal.  Neurological: She is alert and oriented to person, place, and time.  Psychiatric: She has a normal mood and affect. Her speech is normal and behavior is normal.           Assessment & Plan:   1. Depression 2. Anxiety Return to work 04/29/2016 as planned. Let me know how she's doing in 1-2 weeks back at work. Continue psychotherapy. Form completed.   Fara Chute, PA-C Physician Assistant-Certified [facility]

## 2016-04-25 NOTE — Telephone Encounter (Signed)
Call to [facility]. Confirmed patient appointment. She is scheduled for 05/16/16.

## 2016-05-20 ENCOUNTER — Telehealth: Payer: Self-pay

## 2016-05-20 NOTE — Telephone Encounter (Signed)
Patient was seen for bilateral diagnostic mammogram on 05/16/2016. Report received with benign findings. Per Dr.Silva okay to remove from recall and return to screening mammogram. Removed from recall. Please see paper result scanned in EPIC with recommendations from [place].  Routing to provider for final review. Patient agreeable to disposition. Will close encounter.

## 2016-05-24 ENCOUNTER — Ambulatory Visit (INDEPENDENT_AMBULATORY_CARE_PROVIDER_SITE_OTHER): Payer: 59 | Admitting: Cardiovascular Disease

## 2016-05-24 ENCOUNTER — Encounter: Payer: Self-pay | Admitting: Cardiovascular Disease

## 2016-05-24 VITALS — BP 140/72 | HR 77 | Ht 65.0 in | Wt 203.2 lb

## 2016-05-24 DIAGNOSIS — E669 Obesity, unspecified: Secondary | ICD-10-CM | POA: Diagnosis not present

## 2016-05-24 DIAGNOSIS — R0602 Shortness of breath: Secondary | ICD-10-CM

## 2016-05-24 DIAGNOSIS — I10 Essential (primary) hypertension: Secondary | ICD-10-CM | POA: Diagnosis not present

## 2016-05-24 MED ORDER — AMLODIPINE BESYLATE 5 MG PO TABS: 7.5000 mg | ORAL_TABLET | Freq: Every day | ORAL | 11 refills | Status: DC

## 2016-05-24 NOTE — Progress Notes (Signed)
Patient ID: Jillian Steele, female   DOB: May 13, 1961, 55 y.o.   MRN: [id number]    Cardiology Office Note    Date:  05/24/2016   ID:  Jillian Steele, DOB Jun 15, 1961, MRN [id number]  PCP:  Harrison Mons, PA-C  Cardiologist:   Sanda Klein, MD   No chief complaint on file.   History of Present Illness:  Jillian Steele is a 55 y.o. female with complaints of dyspnea, an abnormal ECG and a strong family history of premature CAD and early death (brother with 2 stents in his 37s, father died suddenly at 79)..  She has severe hypertension with exercise. She was only able to walk for just over 3 minutesOn a treadmill stress test. There were no ECG changes at rest or with exercise. Her echocardiogram showed reassuring findings with normal left ventricular systolic function although there was evidence of mild diastolic dysfunction, without signs of elevated filling pressure. Pulmonary function tests were completely normal. We started treatment with amlodipine 2.5 mg after those tests were performed, subsequently increased it to 5 mg daily.  She has noticed marked improvement in exercise tolerance since that time. She is now able to keep up with her colleagues at work and has engaged in a "10,000 steps" challenge.   Past Medical History:  Diagnosis Date  . Abnormal Pap smear of cervix    hx cryotherapy to cervix in her early 20s--paps normal since  . Anxiety   . Depression   . Fibroid 11/2013   fundal - 2 1/2 cm sac mucosal  . Hormone replacement therapy (postmenopausal) 11/12/2011  . Hypertension     Past Surgical History:  Procedure Laterality Date  . ARM WOUND REPAIR / CLOSURE     left  . EYE SURGERY     Lasik  . KNEE ARTHROSCOPY Left 1993  . TUBAL LIGATION  1997    Current Medications: Outpatient Medications Prior to Visit  Medication Sig Dispense Refill  . ALPRAZolam (XANAX) 0.5 MG tablet Take 1 tablet (0.5 mg total) by mouth daily as needed for anxiety. 30 tablet 0  . buPROPion  (WELLBUTRIN XL) 150 MG 24 hr tablet Take 1 tablet (150 mg total) by mouth daily. 30 tablet 3  . calcium carbonate (TUMS EX) 750 MG chewable tablet Chew 1 tablet by mouth 2 (two) times daily.    Marland Kitchen escitalopram (LEXAPRO) 20 MG tablet Take 1 tablet (20 mg total) by mouth daily. 30 tablet 3  . amLODipine (NORVASC) 5 MG tablet Take 1 tablet (5 mg total) by mouth daily. 30 tablet 11   No facility-administered medications prior to visit.      Allergies:   Review of patient's allergies indicates no known allergies.   Social History   Social History  . Marital status: Married    Spouse name: Legrand Como  . Number of children: 2  . Years of education: 12+   Occupational History  . [employer] engineer (Psychologist, forensic) [employer]    [employer]   Social History Main Topics  . Smoking status: Never Smoker  . Smokeless tobacco: Never Used  . Alcohol use No  . Drug use: No  . Sexual activity: Not Currently    Partners: Male    Birth control/ protection: Post-menopausal, Surgical   Other Topics Concern  . None   Social History Narrative   Lives with her husband and their dog, Duke.  Their daughters are grown and live independently. Minimal contact with her younger daughter after a disagreement 06/2012.  Family History:  The patient's family history includes Breast cancer in her maternal aunt; Cancer in her paternal grandfather and paternal grandmother; Emphysema in her father; Heart disease in her brother, father, mother, and sister; Heart disease (age of onset: 53) in her brother; Hypertension in her father and mother; Multiple myeloma in her sister; Nephrolithiasis in her daughter; Prostate cancer in her paternal uncle; Stroke in her mother; Thyroid disease in her brother and mother.   ROS:   Please see the history of present illness.    ROS All other systems reviewed and are negative.   PHYSICAL EXAM:   VS:  BP 140/72   Pulse 77   Ht '5\' 5"'  (1.651 m)   Wt 203 lb  3.2 oz (92.2 kg)   LMP 10/14/2010   BMI 33.81 kg/m    GEN: Well nourished, well developed, in no acute distress  HEENT: normal  Neck: no JVD, carotid bruits, or masses Cardiac: RRR; no murmurs, rubs, or gallops,no edema  Respiratory:  clear to auscultation bilaterally, normal work of breathing GI: soft, nontender, nondistended, + BS MS: no deformity or atrophy  Skin: warm and dry, no rash Neuro:  Alert and Oriented x 3, Strength and sensation are intact Psych: euthymic mood, full affect  Wt Readings from Last 3 Encounters:  05/24/16 203 lb 3.2 oz (92.2 kg)  04/23/16 199 lb 12.8 oz (90.6 kg)  04/08/16 200 lb (90.7 kg)      Studies/Labs Reviewed:   EKG:  EKG is not ordered today.    Recent Labs: 01/19/2016: ALT 22; BUN 13; Creat 0.75; Hemoglobin 13.8; Platelets 247; Potassium 4.3; Sodium 139; TSH 2.74   Lipid Panel    Component Value Date/Time   CHOL 168 01/19/2016 1132   TRIG 118 01/19/2016 1132   HDL 45 (L) 01/19/2016 1132   CHOLHDL 3.7 01/19/2016 1132   VLDL 24 01/19/2016 1132   Selz 99 01/19/2016 1132     ASSESSMENT:    No diagnosis found.   PLAN:  In order of problems listed above:  1. Dyspnea:  Marked improvement in exercise tolerance after starting antihypertensive therapy. For now will defer right and left heart catheterization. 2. HTN: Variably elevated blood pressure at rest, still borderline high today, severe exercise-induced hypertension. Increase amlodipine to 7.5 mg daily. If she loses substantial weight and continues to exercise on a regular basis, we may be able to reduce the dose of the medication in the future 3. Obesity and deconditioning are likely contribution to her dyspnea, but normal pulmonary function test   Medication Adjustments/Labs and Tests Ordered: Current medicines are reviewed at length with the patient today.  Concerns regarding medicines are outlined above.  Medication changes, Labs and Tests ordered today are listed in the  Patient Instructions below. Patient Instructions  Dr Sallyanne Kuster has recommended making the following medication changes: 1. INCREASE Amlodipine to 7.5 mg daily  Dr Sallyanne Kuster recommends that you schedule a follow-up appointment in 6 months. You will receive a reminder letter in the mail two months in advance. If you don't receive a letter, please call our office to schedule the follow-up appointment.  If you need a refill on your cardiac medications before your next appointment, please call your pharmacy.    Signed, Sanda Klein, MD  05/24/2016 11:04 AM    [facility] [place], [place], [place]  [address] Phone: [phone]; Fax: [phone]

## 2016-05-24 NOTE — Patient Instructions (Signed)
Dr Sallyanne Kuster has recommended making the following medication changes: 1. INCREASE Amlodipine to 7.5 mg daily  Dr Sallyanne Kuster recommends that you schedule a follow-up appointment in 6 months. You will receive a reminder letter in the mail two months in advance. If you don't receive a letter, please call our office to schedule the follow-up appointment.  If you need a refill on your cardiac medications before your next appointment, please call your pharmacy.

## 2016-05-28 ENCOUNTER — Telehealth: Payer: Self-pay | Admitting: *Deleted

## 2016-05-28 ENCOUNTER — Encounter: Payer: Self-pay | Admitting: Nurse Practitioner

## 2016-05-28 NOTE — Telephone Encounter (Signed)
Patient is on 04 recall for a repeat- 6 month MMG- please contact patient. Thanks

## 2016-05-29 NOTE — Telephone Encounter (Signed)
Patient had MMG on 05/16/16.  Copy scanned into EPIC.

## 2016-05-29 NOTE — Telephone Encounter (Signed)
Patient removed from recall -eh  

## 2016-07-02 ENCOUNTER — Encounter: Payer: Self-pay | Admitting: Physician Assistant

## 2016-07-02 ENCOUNTER — Ambulatory Visit (INDEPENDENT_AMBULATORY_CARE_PROVIDER_SITE_OTHER): Payer: 59 | Admitting: Physician Assistant

## 2016-07-02 VITALS — BP 120/82 | HR 89 | Temp 98.2°F | Ht 65.5 in | Wt 201.6 lb

## 2016-07-02 DIAGNOSIS — I10 Essential (primary) hypertension: Secondary | ICD-10-CM

## 2016-07-02 DIAGNOSIS — F329 Major depressive disorder, single episode, unspecified: Secondary | ICD-10-CM | POA: Diagnosis not present

## 2016-07-02 DIAGNOSIS — F32A Depression, unspecified: Secondary | ICD-10-CM

## 2016-07-02 DIAGNOSIS — F419 Anxiety disorder, unspecified: Secondary | ICD-10-CM

## 2016-07-02 NOTE — Patient Instructions (Signed)
     IF you received an x-ray today, you will receive an invoice from [facility]. Please contact [facility] at [phone] with questions or concerns regarding your invoice.   IF you received labwork today, you will receive an invoice from [facility]. Please contact [facility] at [phone] with questions or concerns regarding your invoice.   Our billing staff will not be able to assist you with questions regarding bills from these companies.  You will be contacted with the lab results as soon as they are available. The fastest way to get your results is to activate your My Chart account. Instructions are located on the last page of this paperwork. If you have not heard from us regarding the results in 2 weeks, please contact this office.      

## 2016-07-02 NOTE — Progress Notes (Signed)
Patient ID: Jillian Steele, female    DOB: 1961/04/24, 55 y.o.   MRN: [id number]  PCP: Harrison Mons, PA-C  Subjective:   Chief Complaint  Patient presents with  . Follow-up    Pt. was told to follow up after she returned to work    HPI Presents for evaluation of depression and anxiety since her return to work.  Shortness of breath has essentially resolved. Has joined the [place] to walk 10,000 steps a day. Has not yet found new energy. Very hard to stay motivated. Then last week, they asked for volunteers for layoffs at her job. She has applied for one of the spots, but hasn't heard if she will get one. Has lost motivation to exercise, work, "everything." Grandson, in [place], has been removed from the care of his mother Hinton Dyer). This is her son's Gerald Stabs) son. Gerald Stabs has been deployed out of the country but has been recalled home due to the situation. Apparently, audiology and speech therapy were recommended and the child's mother refused. The physician then contacted authorities. When CPS visited the home, it was unkempt, with human and animal feces on the floor.  The patient and her husband will be flying out to help, but are very upset that Gerald Stabs and Hinton Dyer did not contact them when CPS first removed the child from the home approximately 1 month ago, and they only found out when CPS called them. Hinton Dyer has an older child from a previous relationship, a 64 year old daughter, who was signed over to her sister when Gerald Stabs was deployed, because she couldn't manage the two children alone.   Review of Systems No chest pain, SOB, HA, dizziness, vision change, N/V, diarrhea, constipation, dysuria, urinary urgency or frequency, myalgias, arthralgias or rash.     Patient Active Problem List   Diagnosis Date Noted  . Shortness of breath 03/26/2016  . Depression 03/26/2016  . Mild obesity 02/23/2016  . High blood pressure 02/23/2016  . Anxiety 11/12/2011     Prior to Admission  medications   Medication Sig Start Date End Date Taking? Authorizing Provider  ALPRAZolam Duanne Moron) 0.5 MG tablet Take 1 tablet (0.5 mg total) by mouth daily as needed for anxiety. 03/26/16  Yes Arneisha Kincannon, PA-C  amLODipine (NORVASC) 5 MG tablet Take 1.5 tablets (7.5 mg total) by mouth daily. 05/24/16  Yes Mihai Croitoru, MD  buPROPion (WELLBUTRIN XL) 150 MG 24 hr tablet Take 1 tablet (150 mg total) by mouth daily. 03/26/16  Yes Laurali Goddard, PA-C  calcium carbonate (TUMS EX) 750 MG chewable tablet Chew 1 tablet by mouth 2 (two) times daily.   Yes Historical Provider, MD  escitalopram (LEXAPRO) 20 MG tablet Take 1 tablet (20 mg total) by mouth daily. 04/08/16  Yes Izabell Schalk, PA-C     No Known Allergies     Objective:  Physical Exam  Constitutional: She is oriented to person, place, and time. She appears well-developed and well-nourished. She is active and cooperative. No distress.  BP 120/82   Pulse 89   Temp 98.2 F (36.8 C) (Oral)   Ht 5' 5.5" (1.664 m)   Wt 201 lb 9.6 oz (91.4 kg)   LMP 10/14/2010   SpO2 98%   BMI 33.04 kg/m   HENT:  Head: Normocephalic and atraumatic.  Right Ear: Hearing normal.  Left Ear: Hearing normal.  Eyes: Conjunctivae are normal. No scleral icterus.  Neck: Normal range of motion. Neck supple. No thyromegaly present.  Cardiovascular: Normal rate, regular rhythm  and normal heart sounds.   Pulses:      Radial pulses are 2+ on the right side, and 2+ on the left side.  Pulmonary/Chest: Effort normal and breath sounds normal.  Lymphadenopathy:       Head (right side): No tonsillar, no preauricular, no posterior auricular and no occipital adenopathy present.       Head (left side): No tonsillar, no preauricular, no posterior auricular and no occipital adenopathy present.    She has no cervical adenopathy.       Right: No supraclavicular adenopathy present.       Left: No supraclavicular adenopathy present.  Neurological: She is alert and oriented to  person, place, and time. No sensory deficit.  Skin: Skin is warm, dry and intact. No rash noted. No cyanosis or erythema. Nails show no clubbing.  Psychiatric: She has a normal mood and affect. Her speech is normal and behavior is normal.           Assessment & Plan:   1. Essential hypertension Controlled.  2. Anxiety 3. Depression Stable, though recent increased stress may prove to aggravate this again. Hopefully, her voluntary layoff will be granted and she'll be able to use the time out of work to address the situation with her grandson in [place].   Fara Chute, PA-C Physician Assistant-Certified [facility]

## 2016-07-05 ENCOUNTER — Other Ambulatory Visit: Payer: Self-pay | Admitting: Physician Assistant

## 2016-07-05 DIAGNOSIS — F32A Depression, unspecified: Secondary | ICD-10-CM

## 2016-07-05 DIAGNOSIS — F419 Anxiety disorder, unspecified: Secondary | ICD-10-CM

## 2016-07-05 DIAGNOSIS — F329 Major depressive disorder, single episode, unspecified: Secondary | ICD-10-CM

## 2016-07-08 NOTE — Telephone Encounter (Signed)
Faxed xanax 

## 2016-07-08 NOTE — Telephone Encounter (Signed)
Meds ordered this encounter  Medications  . escitalopram (LEXAPRO) 20 MG tablet    Sig: TAKE 1 TABLET (20 MG TOTAL) BY MOUTH DAILY.    Dispense:  90 tablet    Refill:  3  . ALPRAZolam (XANAX) 0.5 MG tablet    Sig: TAKE 1 TABLET BY MOUTH EVERY DAY AS NEEDED ANXIETY    Dispense:  30 tablet    Refill:  0    Not to exceed 5 additional fills before 09/22/2016.

## 2016-07-18 ENCOUNTER — Other Ambulatory Visit: Payer: Self-pay | Admitting: Physician Assistant

## 2016-07-18 DIAGNOSIS — F329 Major depressive disorder, single episode, unspecified: Secondary | ICD-10-CM

## 2016-07-18 DIAGNOSIS — F32A Depression, unspecified: Secondary | ICD-10-CM

## 2016-08-15 ENCOUNTER — Other Ambulatory Visit: Payer: Self-pay | Admitting: Physician Assistant

## 2016-08-15 DIAGNOSIS — F329 Major depressive disorder, single episode, unspecified: Secondary | ICD-10-CM

## 2016-08-15 DIAGNOSIS — F32A Depression, unspecified: Secondary | ICD-10-CM

## 2016-09-20 ENCOUNTER — Other Ambulatory Visit: Payer: Self-pay | Admitting: Physician Assistant

## 2016-09-20 DIAGNOSIS — F32A Depression, unspecified: Secondary | ICD-10-CM

## 2016-09-20 DIAGNOSIS — F329 Major depressive disorder, single episode, unspecified: Secondary | ICD-10-CM

## 2016-09-21 NOTE — Telephone Encounter (Signed)
Last visit 07/02/16 Next visit due now, I have called patient to advise her to call back to make appt, she states she will call for appointment Ok to refill per protocol

## 2016-10-21 ENCOUNTER — Other Ambulatory Visit: Payer: Self-pay | Admitting: Physician Assistant

## 2016-10-21 DIAGNOSIS — F329 Major depressive disorder, single episode, unspecified: Secondary | ICD-10-CM

## 2016-10-21 DIAGNOSIS — F32A Depression, unspecified: Secondary | ICD-10-CM

## 2016-10-21 NOTE — Telephone Encounter (Signed)
SS refill req bupropion

## 2016-10-22 ENCOUNTER — Ambulatory Visit (INDEPENDENT_AMBULATORY_CARE_PROVIDER_SITE_OTHER): Payer: 59 | Admitting: Physician Assistant

## 2016-10-22 ENCOUNTER — Encounter: Payer: Self-pay | Admitting: Physician Assistant

## 2016-10-22 VITALS — BP 124/72 | HR 96 | Temp 99.2°F | Resp 18 | Ht 65.5 in | Wt 200.0 lb

## 2016-10-22 DIAGNOSIS — F419 Anxiety disorder, unspecified: Secondary | ICD-10-CM

## 2016-10-22 DIAGNOSIS — I10 Essential (primary) hypertension: Secondary | ICD-10-CM | POA: Diagnosis not present

## 2016-10-22 DIAGNOSIS — F329 Major depressive disorder, single episode, unspecified: Secondary | ICD-10-CM

## 2016-10-22 MED ORDER — BUPROPION HCL ER (XL) 300 MG PO TB24: 300.0000 mg | ORAL_TABLET | Freq: Every day | ORAL | 3 refills | Status: DC

## 2016-10-22 MED ORDER — ALPRAZOLAM 0.5 MG PO TABS: ORAL_TABLET | ORAL | 0 refills | Status: DC

## 2016-10-22 NOTE — Patient Instructions (Addendum)
We are INCREASING the bupropion (Wellbutrin) from 150 mg to 300 mg. You can use up any of the 150 mg tablets that you have left by taking 2 of them together each morning.  Continue the Lexapro 20 mg.    IF you received an x-ray today, you will receive an invoice from [facility]. Please contact [facility] at [phone] with questions or concerns regarding your invoice.   IF you received labwork today, you will receive an invoice from [facility]. Please contact [facility] at [phone] with questions or concerns regarding your invoice.   Our billing staff will not be able to assist you with questions regarding bills from these companies.  You will be contacted with the lab results as soon as they are available. The fastest way to get your results is to activate your My Chart account. Instructions are located on the last page of this paperwork. If you have not heard from [place] regarding the results in 2 weeks, please contact this office.

## 2016-10-22 NOTE — Progress Notes (Signed)
Patient ID: Jillian Steele, female    DOB: [birth year], 56 y.o.   MRN: [id number]  PCP: Harrison Mons, PA-C  Chief Complaint  Patient presents with  . Medication Refill    lexapro, wellbutrin    Subjective:   Presents for medication refill.  Isn't working. Would like to, but hasn't found something else since retiring from [employer]. Thinks that she should have stayed, despite the stress, because 2 weeks after she left, her daughter unexpectedly lost her job. She has several medical conditions and depends on the patient heavily for social support. "It's throwing me under the bus. Because now I can't think about me, I've got her." Even with the Wellbutrin and Lexapro she feels down. "I don't know how I ever worked. The hours that I did. And I don't know how I'm going to go back to work mentally."  Her daughter has lost her hearing over the past 10 years. She was not able to adapt to changes implemented when the company changed hands. She does not qualify for Medicare/Medicare at this point. Is paying for [employer] until her severance package ends in July 2018. She has been advised that the only solution is a cochlear implant.  They are working through all the required first steps in preparation, which is a long process.  Her husband's parents have been ill.  It has made looking for work very difficult.   Review of Systems  Constitutional: Negative.   HENT: Negative for sore throat.   Eyes: Negative for visual disturbance.  Respiratory: Negative for cough, chest tightness, shortness of breath and wheezing.   Cardiovascular: Negative for chest pain and palpitations.  Gastrointestinal: Negative for abdominal pain, diarrhea, nausea and vomiting.  Genitourinary: Negative for dysuria, frequency, hematuria and urgency.  Musculoskeletal: Negative for arthralgias and myalgias.  Skin: Negative for rash.  Neurological: Negative for dizziness, weakness and headaches.    Psychiatric/Behavioral: Positive for dysphoric mood. Negative for decreased concentration, self-injury, sleep disturbance and suicidal ideas. The patient is nervous/anxious.         Patient Active Problem List   Diagnosis Date Noted  . Depression 03/26/2016  . Mild obesity 02/23/2016  . High blood pressure 02/23/2016  . Anxiety 11/12/2011     Prior to Admission medications   Medication Sig Start Date End Date Taking? Authorizing Provider  ALPRAZolam Duanne Moron) 0.5 MG tablet TAKE 1 TABLET BY MOUTH EVERY DAY AS NEEDED ANXIETY 07/08/16  Yes Jt Brabec, PA-C  amLODipine (NORVASC) 5 MG tablet Take 1.5 tablets (7.5 mg total) by mouth daily. 05/24/16  Yes Mihai Croitoru, MD  buPROPion (WELLBUTRIN XL) 150 MG 24 hr tablet TAKE 1 TABLET BY MOUTH EVERY DAY 09/21/16  Yes Skarleth Delmonico, PA-C  calcium carbonate (TUMS EX) 750 MG chewable tablet Chew 1 tablet by mouth 2 (two) times daily.   Yes Historical Provider, MD  escitalopram (LEXAPRO) 20 MG tablet TAKE 1 TABLET (20 MG TOTAL) BY MOUTH DAILY. 07/08/16  Yes Annabelle Rexroad, PA-C     No Known Allergies     Objective:  Physical Exam  Constitutional: She is oriented to person, place, and time. She appears well-developed and well-nourished. She is active and cooperative. No distress.  BP 124/72 (BP Location: Right Arm, Patient Position: Sitting, Cuff Size: Small)   Pulse 96   Temp 99.2 F (37.3 C) (Oral)   Resp 18   Ht 5' 5.5" (1.664 m)   Wt 200 lb (90.7 kg)   LMP 10/14/2010   SpO2 99%  BMI 32.78 kg/m   HENT:  Head: Normocephalic and atraumatic.  Right Ear: Hearing normal.  Left Ear: Hearing normal.  Eyes: Conjunctivae are normal. No scleral icterus.  Neck: Normal range of motion. Neck supple. No thyromegaly present.  Cardiovascular: Normal rate, regular rhythm and normal heart sounds.   Pulses:      Radial pulses are 2+ on the right side, and 2+ on the left side.  Pulmonary/Chest: Effort normal and breath sounds normal.   Lymphadenopathy:       Head (right side): No tonsillar, no preauricular, no posterior auricular and no occipital adenopathy present.       Head (left side): No tonsillar, no preauricular, no posterior auricular and no occipital adenopathy present.    She has no cervical adenopathy.       Right: No supraclavicular adenopathy present.       Left: No supraclavicular adenopathy present.  Neurological: She is alert and oriented to person, place, and time. No sensory deficit.  Skin: Skin is warm, dry and intact. No rash noted. No cyanosis or erythema. Nails show no clubbing.  Psychiatric: She has a normal mood and affect. Her speech is normal and behavior is normal.           Assessment & Plan:   1. Reactive depression 2. Anxiety INCREASE bupropion to 300 mg. Continue escitalopram and PRN alprazolam. Discussed ways to seek stress relief and set/maintain boundaries with her daughter. - ALPRAZolam (XANAX) 0.5 MG tablet; TAKE 1 TABLET BY MOUTH EVERY DAY AS NEEDED ANXIETY  Dispense: 30 tablet; Refill: 0 - buPROPion (WELLBUTRIN XL) 300 MG 24 hr tablet; Take 1 tablet (300 mg total) by mouth daily.  Dispense: 90 tablet; Refill: 3  3. Essential hypertension Controlled. Continue current treatment.   Fara Chute, PA-C Physician Assistant-Certified [facility]

## 2017-01-22 ENCOUNTER — Encounter: Payer: Self-pay | Admitting: Nurse Practitioner

## 2017-01-22 ENCOUNTER — Ambulatory Visit (INDEPENDENT_AMBULATORY_CARE_PROVIDER_SITE_OTHER): Admitting: Nurse Practitioner

## 2017-01-22 VITALS — BP 122/76 | HR 80 | Ht 64.75 in | Wt 204.0 lb

## 2017-01-22 DIAGNOSIS — Z23 Encounter for immunization: Secondary | ICD-10-CM | POA: Diagnosis not present

## 2017-01-22 DIAGNOSIS — E559 Vitamin D deficiency, unspecified: Secondary | ICD-10-CM

## 2017-01-22 DIAGNOSIS — Z0000 Encounter for general adult medical examination without abnormal findings: Secondary | ICD-10-CM | POA: Diagnosis not present

## 2017-01-22 DIAGNOSIS — Z01419 Encounter for gynecological examination (general) (routine) without abnormal findings: Secondary | ICD-10-CM | POA: Diagnosis not present

## 2017-01-22 LAB — CBC
HCT: 41.2 % (ref 35.0–45.0)
Hemoglobin: 13.7 g/dL (ref 11.7–15.5)
MCH: 28.2 pg (ref 27.0–33.0)
MCHC: 33.3 g/dL (ref 32.0–36.0)
MCV: 84.9 fL (ref 80.0–100.0)
MPV: 8.7 fL (ref 7.5–12.5)
PLATELETS: 232 10*3/uL (ref 140–400)
RBC: 4.85 MIL/uL (ref 3.80–5.10)
RDW: 14.8 % (ref 11.0–15.0)
WBC: 7.8 10*3/uL (ref 3.8–10.8)

## 2017-01-22 LAB — COMPREHENSIVE METABOLIC PANEL
ALK PHOS: 97 U/L (ref 33–130)
ALT: 17 U/L (ref 6–29)
AST: 13 U/L (ref 10–35)
Albumin: 4.5 g/dL (ref 3.6–5.1)
BILIRUBIN TOTAL: 0.5 mg/dL (ref 0.2–1.2)
BUN: 13 mg/dL (ref 7–25)
CO2: 23 mmol/L (ref 20–31)
Calcium: 9.9 mg/dL (ref 8.6–10.4)
Chloride: 104 mmol/L (ref 98–110)
Creat: 0.91 mg/dL (ref 0.50–1.05)
GLUCOSE: 91 mg/dL (ref 65–99)
POTASSIUM: 4.3 mmol/L (ref 3.5–5.3)
Sodium: 140 mmol/L (ref 135–146)
TOTAL PROTEIN: 7.3 g/dL (ref 6.1–8.1)

## 2017-01-22 LAB — LIPID PANEL
CHOL/HDL RATIO: 3.6 ratio (ref <5.0)
CHOLESTEROL: 199 mg/dL (ref <200)
HDL: 55 mg/dL (ref >50)
LDL Cholesterol: 120 mg/dL — ABNORMAL HIGH (ref <100)
Triglycerides: 119 mg/dL (ref <150)
VLDL: 24 mg/dL (ref <30)

## 2017-01-22 LAB — TSH: TSH: 3.31 mIU/L

## 2017-01-22 NOTE — Progress Notes (Signed)
56 y.o. G67P2002 Married  Caucasian Fe here for annual exam.  Now off OTC Vit D - (but if low will do better with RX weekly if needed).  She took a severance PGK from her job and now going to look for another job.  She, in the meantime is helping her daughter who is now deaf.  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  She is unable to work.  Last week had cocholear implant and they are waiting for swelling and trauma from surgery to heal and then going to activate the implant.  Patient's last menstrual period was 10/14/2010 (approximate).          Sexually active: Yes.    The current method of family planning is tubal ligation and post menopausal status.    Exercising: Yes.    Home exercise routine includes walking at least 6000 steps everyday. Smoker:  no  Health Maintenance: Pap: 01/16/15, Negative with neg HR HPV (history of abnormal in 1980's)  11/26/11, Negative with neg HR HPV MMG:05/16/16, 3D Bilateral Diagnostic, Bi-Rads 2:  Benign Colonoscopy:05/14/13, tubular adenoma with Dr.Brodie, repeat in 5 years BMD: Never TDaP: 05/15/07 Hep C: 01/19/16 HIV: 01/16/15 Labs: Fasting labs to be done today   reports that she has never smoked. She has never used smokeless tobacco. She reports that she does not drink alcohol or use drugs.  Past Medical History:  Diagnosis Date  . Abnormal Pap smear of cervix    hx cryotherapy to cervix in her early 20s--paps normal since  . Anxiety   . Depression   . Fibroid 11/2013   fundal - 2 1/2 cm sac mucosal  . Hormone replacement therapy (postmenopausal) 11/12/2011  . Hypertension     Past Surgical History:  Procedure Laterality Date  . ARM WOUND REPAIR / CLOSURE     left  . EYE SURGERY     Lasik  . KNEE ARTHROSCOPY Left 1993  . TUBAL LIGATION  1997    Current Outpatient Prescriptions  Medication Sig Dispense Refill  . ALPRAZolam (XANAX) 0.5 MG tablet TAKE 1 TABLET BY MOUTH EVERY DAY AS NEEDED ANXIETY 30 tablet 0   . amLODipine (NORVASC) 5 MG tablet Take 1.5 tablets (7.5 mg total) by mouth daily. 45 tablet 11  . buPROPion (WELLBUTRIN XL) 300 MG 24 hr tablet Take 1 tablet (300 mg total) by mouth daily. 90 tablet 3  . calcium carbonate (TUMS EX) 750 MG chewable tablet Chew 1 tablet by mouth 2 (two) times daily.    Marland Kitchen escitalopram (LEXAPRO) 20 MG tablet TAKE 1 TABLET (20 MG TOTAL) BY MOUTH DAILY. 90 tablet 3   No current facility-administered medications for this visit.     Family History  Problem Relation Age of Onset  . Heart disease Mother   . Thyroid disease Mother   . Hypertension Mother   . Stroke Mother   . Heart disease Father   . Hypertension Father   . Emphysema Father   . Heart disease Sister   . Multiple myeloma Sister   . Heart disease Brother 21    heart stents X 2  . Thyroid disease Brother   . Cancer Paternal Grandmother     lung cancer  . Cancer Paternal Grandfather     prostate cancer  . Nephrolithiasis Daughter   . Heart disease Brother     Rheumatic Heart Disease  . Prostate cancer Paternal Uncle   . Breast cancer Maternal Aunt  x2  . Colon cancer Neg Hx     ROS:  Pertinent items are noted in HPI.  Otherwise, a comprehensive ROS was negative.  Exam:   BP 122/76 (BP Location: Right Arm, Patient Position: Sitting, Cuff Size: Large)   Pulse 80   Ht 5' 4.75" (1.645 m)   Wt 204 lb (92.5 kg)   LMP 10/14/2010 (Approximate)   BMI 34.21 kg/m  Height: 5' 4.75" (164.5 cm) Ht Readings from Last 3 Encounters:  01/22/17 5' 4.75" (1.645 m)  10/22/16 5' 5.5" (1.664 m)  07/02/16 5' 5.5" (1.664 m)    General appearance: alert, cooperative and appears stated age Head: Normocephalic, without obvious abnormality, atraumatic Neck: no adenopathy, supple, symmetrical, trachea midline and thyroid normal to inspection and palpation Lungs: clear to auscultation bilaterally Breasts: normal appearance, no masses or tenderness Heart: regular rate and rhythm Abdomen: soft,  non-tender; no masses,  no organomegaly Extremities: extremities normal, atraumatic, no cyanosis or edema Skin: Skin color, texture, turgor normal. No rashes or lesions Lymph nodes: Cervical, supraclavicular, and axillary nodes normal. No abnormal inguinal nodes palpated Neurologic: Grossly normal   Pelvic: External genitalia:  no lesions              Urethra:  normal appearing urethra with no masses, tenderness or lesions              Bartholin's and Skene's: normal                 Vagina: normal appearing vagina with normal color and discharge, no lesions              Cervix: anteverted              Pap taken: No. Bimanual Exam:  Uterus:  normal size, contour, position, consistency, mobility, non-tender              Adnexa: no mass, fullness, tenderness               Rectovaginal: Confirms               Anus:  normal sphincter tone, no lesions  Chaperone present: yes  A:  Well Woman with normal exam  Menopausal no HRT  Remote history of abnormal pap in the 80's History of obesity             [place]:  Heart disease  Update Immunization  P:   Reviewed health and wellness pertinent to exam  Pap smear not done  Mammogram is due 8/18  TDaP given today  Follow with labs  Counseled on breast self exam, mammography screening, adequate intake of calcium and vitamin D, diet and exercise, Kegel's exercises return annually or prn  An After Visit Summary was printed and given to the patient.

## 2017-01-22 NOTE — Patient Instructions (Signed)

## 2017-01-23 LAB — VITAMIN D 25 HYDROXY (VIT D DEFICIENCY, FRACTURES): VIT D 25 HYDROXY: 25 ng/mL — AB (ref 30–100)

## 2017-01-23 NOTE — Progress Notes (Signed)
Encounter reviewed by Dr. Dasja Brase Amundson C. Silva.  

## 2017-01-24 ENCOUNTER — Other Ambulatory Visit: Payer: Self-pay | Admitting: Nurse Practitioner

## 2017-01-24 DIAGNOSIS — E559 Vitamin D deficiency, unspecified: Secondary | ICD-10-CM

## 2017-01-24 MED ORDER — VITAMIN D (ERGOCALCIFEROL) 1.25 MG (50000 UNIT) PO CAPS: 50000.0000 [IU] | ORAL_CAPSULE | ORAL | 0 refills | Status: DC

## 2017-04-22 ENCOUNTER — Encounter: Payer: Self-pay | Admitting: Physician Assistant

## 2017-04-22 ENCOUNTER — Ambulatory Visit (INDEPENDENT_AMBULATORY_CARE_PROVIDER_SITE_OTHER): Admitting: Physician Assistant

## 2017-04-22 VITALS — BP 128/74 | HR 93 | Temp 98.4°F | Resp 18 | Ht 64.75 in | Wt 207.5 lb

## 2017-04-22 DIAGNOSIS — F419 Anxiety disorder, unspecified: Secondary | ICD-10-CM

## 2017-04-22 DIAGNOSIS — I10 Essential (primary) hypertension: Secondary | ICD-10-CM

## 2017-04-22 DIAGNOSIS — F329 Major depressive disorder, single episode, unspecified: Secondary | ICD-10-CM | POA: Diagnosis not present

## 2017-04-22 DIAGNOSIS — Z01818 Encounter for other preprocedural examination: Secondary | ICD-10-CM | POA: Diagnosis not present

## 2017-04-22 DIAGNOSIS — M79671 Pain in right foot: Secondary | ICD-10-CM | POA: Diagnosis not present

## 2017-04-22 DIAGNOSIS — F32A Depression, unspecified: Secondary | ICD-10-CM

## 2017-04-22 MED ORDER — AMLODIPINE BESYLATE 5 MG PO TABS: 7.5000 mg | ORAL_TABLET | Freq: Every day | ORAL | 3 refills | Status: DC

## 2017-04-22 NOTE — Patient Instructions (Addendum)
Continue with the ice to the achilles tendon. I suspect this will go away after your knee no longer hurts.   We recommend that you schedule a mammogram for breast cancer screening. Typically, you do not need a referral to do this. Please contact a local imaging center to schedule your mammogram.  [facility] - [phone]  *ask for the Radiology Department [facility] [place] [facility] - [phone] or [phone]  [facility] - [phone] [place] [phone] [facility] - [phone]  *ask for the [facility] - [phone]  *ask for the Radiology Department [facility] - [phone]  *ask for the [facility] - [phone]    IF you received an x-ray today, you will receive an invoice from [facility]. Please contact [facility] Radiology at [phone] with questions or concerns regarding your invoice.   IF you received labwork today, you will receive an invoice from [facility]. Please contact [facility] at [phone] with questions or concerns regarding your invoice.   Our billing staff will not be able to assist you with questions regarding bills from these companies.  You will be contacted with the lab results as soon as they are available. The fastest way to get your results is to activate your My Chart account. Instructions are located on the last page of this paperwork. If you have not heard from [place] regarding the results in 2 weeks, please contact this office.

## 2017-04-22 NOTE — Assessment & Plan Note (Signed)
Well controlled. Continue.

## 2017-04-22 NOTE — Assessment & Plan Note (Signed)
Well controlled. Continue current amlodipine 7.5 mg daily.

## 2017-04-22 NOTE — Progress Notes (Signed)
Patient ID: Jillian Steele, female    DOB: 1961/10/11, 56 y.o.   MRN: [id number]  PCP: Harrison Mons, PA-C  Chief Complaint  Patient presents with  . Medication Refill    per pt would like provider to become main provider for her blood pressure needs aside from her cardiologist.  Amlodipine 5 MG  . Paperwork    Pt is having surgery at the end of the month and she needs provider to fill out forms for medical release.    Subjective:   Presents for medication refill and surgical clearance for upcoming LEFT TKR with Dr. Alvan Dame. She has already had clearance from cardiology, and has pre-op labs scheduled at [facility] 05/05/2017.  Going forward, she would like me to manage her HTN, to reduce the number of specialty visits.  Lipids 01/22/2017 with GYN: TC 199 TG 119 HDL 55 LDL 120  She has been working on getting more exercise, but has started having pain in the RIGHT heel, at the site of the Achilles insertion on the posterior calcaneous. No pain on the bottom of the foot. Pain increases the longer she walks. Ice seems to help reduce the discomfort and swelling.  No CP, SOB, HA, dizziness.    Review of Systems As above.    Patient Active Problem List   Diagnosis Date Noted  . Depression 03/26/2016  . BMI 32.0-32.9,adult 02/23/2016  . High blood pressure 02/23/2016  . Anxiety 11/12/2011     Prior to Admission medications   Medication Sig Start Date End Date Taking? Authorizing Provider  ALPRAZolam Duanne Moron) 0.5 MG tablet TAKE 1 TABLET BY MOUTH EVERY DAY AS NEEDED ANXIETY 10/22/16  Yes Mackenzy Eisenberg, PA-C  amLODipine (NORVASC) 5 MG tablet Take 1.5 tablets (7.5 mg total) by mouth daily. 05/24/16  Yes Croitoru, Mihai, MD  buPROPion (WELLBUTRIN XL) 300 MG 24 hr tablet Take 1 tablet (300 mg total) by mouth daily. 10/22/16  Yes Zafiro Routson, PA-C  calcium carbonate (TUMS EX) 750 MG chewable tablet Chew 1 tablet by mouth 2 (two) times daily.   Yes [provider]  escitalopram  (LEXAPRO) 20 MG tablet TAKE 1 TABLET (20 MG TOTAL) BY MOUTH DAILY. 07/08/16  Yes Tarrence Enck, PA-C  Vitamin D, Ergocalciferol, (DRISDOL) 50000 units CAPS capsule Take 1 capsule (50,000 Units total) by mouth every 7 (seven) days. 01/24/17  Yes Kem Boroughs, FNP     No Known Allergies     Objective:  Physical Exam  Constitutional: She is oriented to person, place, and time. She appears well-developed and well-nourished. She is active and cooperative. No distress.  BP 128/74 (BP Location: Right Arm, Patient Position: Sitting, Cuff Size: Large)   Pulse 93   Temp 98.4 F (36.9 C) (Oral)   Resp 18   Ht 5' 4.75" (1.645 m)   Wt 207 lb 8 oz (94.1 kg)   LMP 10/14/2010 (Approximate)   SpO2 100%   BMI 34.80 kg/m   HENT:  Head: Normocephalic and atraumatic.  Right Ear: Hearing normal.  Left Ear: Hearing normal.  Eyes: Conjunctivae are normal. No scleral icterus.  Neck: Normal range of motion. Neck supple. No thyromegaly present.  Cardiovascular: Normal rate, regular rhythm and normal heart sounds.   No murmur heard. Pulses:      Radial pulses are 2+ on the right side, and 2+ on the left side.       Dorsalis pedis pulses are 2+ on the right side, and 2+ on the left side.  Posterior tibial pulses are 2+ on the right side, and 2+ on the left side.  No carotid bruits.  Pulmonary/Chest: Effort normal and breath sounds normal.  Lymphadenopathy:       Head (right side): No tonsillar, no preauricular, no posterior auricular and no occipital adenopathy present.       Head (left side): No tonsillar, no preauricular, no posterior auricular and no occipital adenopathy present.    She has no cervical adenopathy.       Right: No supraclavicular adenopathy present.       Left: No supraclavicular adenopathy present.  Neurological: She is alert and oriented to person, place, and time. No sensory deficit.  Skin: Skin is warm, dry and intact. No rash noted. No cyanosis or erythema. Nails show no  clubbing.  Psychiatric: She has a normal mood and affect. Her speech is normal and behavior is normal.        Assessment & Plan:   Problem List Items Addressed This Visit    Anxiety    Controlled. Continue current treatment.      High blood pressure - Primary    Well controlled. Continue current amlodipine 7.5 mg daily.      Relevant Medications   amLODipine (NORVASC) 5 MG tablet   Depression    Well controlled. Continue.       Other Visit Diagnoses    Preoperative examination       Proceed with pre-operative labs. Cleared otherwise.   Pain of right heel       like due to compensation for LEFT knee pain       Return in about 6 months (around 10/23/2017) for re-evaluation of blood pressure, sooner if needed.   Fara Chute, PA-C [facility] [place]

## 2017-04-22 NOTE — Progress Notes (Signed)
Subjective:    Patient ID: Jillian Steele, female    DOB: 08-17-1961, 56 y.o.   MRN: [id number]  HPI Jillian Steele is a 56 year old female presenting for medication refill. She is also presenting for a preoperative clearance for left knee surgery on 05/12/2017. She has already received clearance from her cardiologist. She has an appointment scheduled at [facility] on 05/05/2017 for blood work. Last EKG, echocardiogram and stress test 03/07/2016.  She actually does not need any medication refills today, but she would like to transition to getting her medications here. She will follow up with cardiology as needed.  After she stopped working, she began trying to walk regularly as part of an exercise regimen. She began to start having right heel pain. Describes pain as "burning." It has been intermittent for several months since then, but in the past several weeks pain has become constant. Exacerbated with walking and will become swollen with an hour of activity. Tried ice, and that seemed to help.  Review of Systems  Constitutional: Negative for activity change, chills, fatigue and fever.  HENT: Negative for congestion, postnasal drip, rhinorrhea, sinus pain, sinus pressure, sneezing and sore throat.   Eyes: Negative for visual disturbance.  Respiratory: Negative for cough.   Cardiovascular: Negative for chest pain, palpitations and leg swelling.  Gastrointestinal: Negative for abdominal pain, blood in stool, constipation, diarrhea, nausea and vomiting.  Endocrine: Negative for polyphagia and polyuria.  Genitourinary: Negative for difficulty urinating, dysuria and hematuria.  Musculoskeletal: Positive for arthralgias. Negative for back pain, myalgias and neck pain.  Skin: Negative for rash.  Neurological: Negative for dizziness, weakness, numbness and headaches.  Psychiatric/Behavioral: Negative for confusion and suicidal ideas. The patient is nervous/anxious.      Patient Active  Problem List   Diagnosis Date Noted  . Depression 03/26/2016  . BMI 32.0-32.9,adult 02/23/2016  . High blood pressure 02/23/2016  . Anxiety 11/12/2011   Prior to Admission medications   Medication Sig Start Date End Date Taking? Authorizing Provider  ALPRAZolam Duanne Moron) 0.5 MG tablet TAKE 1 TABLET BY MOUTH EVERY DAY AS NEEDED ANXIETY 10/22/16  Yes Jeffery, [name], PA-C  amLODipine (NORVASC) 5 MG tablet Take 1.5 tablets (7.5 mg total) by mouth daily. 05/24/16  Yes Croitoru, Mihai, MD  buPROPion (WELLBUTRIN XL) 300 MG 24 hr tablet Take 1 tablet (300 mg total) by mouth daily. 10/22/16  Yes Jeffery, [name], PA-C  calcium carbonate (TUMS EX) 750 MG chewable tablet Chew 1 tablet by mouth 2 (two) times daily.   Yes [provider]  escitalopram (LEXAPRO) 20 MG tablet TAKE 1 TABLET (20 MG TOTAL) BY MOUTH DAILY. 07/08/16  Yes Jeffery, [name], PA-C  Vitamin D, Ergocalciferol, (DRISDOL) 50000 units CAPS capsule Take 1 capsule (50,000 Units total) by mouth every 7 (seven) days. 01/24/17  Yes Kem Boroughs, FNP   No Known Allergies      Objective:   Physical Exam  Constitutional: She appears well-developed and well-nourished.  BP 128/74 (BP Location: Right Arm, Patient Position: Sitting, Cuff Size: Large)   Pulse 93   Temp 98.4 F (36.9 C) (Oral)   Resp 18   Ht 5' 4.75" (1.645 m)   Wt 207 lb 8 oz (94.1 kg)   LMP 10/14/2010 (Approximate)   SpO2 100%   BMI 34.80 kg/m    HENT:  Head: Normocephalic and atraumatic.  Right Ear: External ear normal.  Left Ear: External ear normal.  Nose: Nose normal.  Neck: Normal range of motion. Neck supple.  Cardiovascular:  Normal rate, regular rhythm, S1 normal, S2 normal and normal heart sounds.   Pulses:      Carotid pulses are 2+ on the right side, and 2+ on the left side.      Radial pulses are 2+ on the right side, and 2+ on the left side.       Dorsalis pedis pulses are 2+ on the right side, and 2+ on the left side.       Posterior tibial  pulses are 2+ on the right side, and 2+ on the left side.  Pulmonary/Chest: Effort normal and breath sounds normal.  Lymphadenopathy:    She has no cervical adenopathy.  Neurological: She is alert.  Skin: Skin is warm and dry.  Psychiatric: She has a normal mood and affect. Her behavior is normal.      Assessment & Plan:   1. Essential hypertension Stable. RF amlodipine. Follow up in 6 months. - amLODipine (NORVASC) 5 MG tablet; Take 1.5 tablets (7.5 mg total) by mouth daily.  Dispense: 135 tablet; Refill: 3  2. Anxiety 3. Depression, unspecified depression type Stable. No refills needed at this time. Follow up in 6 months, sooner if needed.  4. Preoperative examination HTN continues to be stable. Will have labs drawn 05/05/2017. Surgical clearance given. Preoperative clearance form completed and returned to patient.   5. Pain of right heel Suspected mild Achilles tendonitis. Encouraged continued rest and icing. Expect improvement following left knee surgery. If pain persists, advise follow up with Dr. Alvan Dame.   Respectfully, Weldon Picking, PA-S

## 2017-04-22 NOTE — Assessment & Plan Note (Signed)
Controlled. Continue current treatment. 

## 2017-04-30 NOTE — H&P (Signed)
UNICOMPARTMENTAL KNEE ADMISSION H&P  Patient is being admitted for left medial unicompartmental knee arthroplasty.  Subjective:  Chief Complaint:  Left knee medial compartmental primary OA /pain    HPI: Jillian Steele, 56 y.o. female female, has a history of pain and functional disability in the left and has failed non-surgical conservative treatments for greater than 12 weeks to include NSAID's and/or analgesics, corticosteriod injections and activity modification.  Onset of symptoms was gradual, starting 3+ years ago with gradually worsening course since that time. The patient noted prior procedures on the knee to include  arthroscopy and menisectomy on the left knee(s).  Patient currently rates pain in the left knee(s) at 8 out of 10 with activity. Patient has worsening of pain with activity and weight bearing, pain that interferes with activities of daily living, pain with passive range of motion and crepitus.  Patient has evidence of periarticular osteophytes and joint space narrowing of the medial compartment by imaging studies.  There is no active infection.  Risks, benefits and expectations were discussed with the patient.  Risks including but not limited to the risk of anesthesia, blood clots, nerve damage, blood vessel damage, failure of the prosthesis, infection and up to and including death.  Patient understand the risks, benefits and expectations and wishes to proceed with surgery.   PCP: Harrison Mons, PA-C  D/C Plans:      Home  Post-op Meds:       No Rx given  Tranexamic Acid:      To be given - IV   Decadron:    It is to be given  FYI:     ASA  Norco   DME:    Rx given for - RW and 3-n-1  PT:    OPPT - Rx given   Past Medical History:  Diagnosis Date  . Abnormal Pap smear of cervix    hx cryotherapy to cervix in her early 20s--paps normal since  . Anxiety   . Depression   . Fibroid 11/2013   fundal - 2 1/2 cm sac mucosal  . Hormone replacement therapy  (postmenopausal) 11/12/2011  . Hypertension      Past Surgical History:  Procedure Laterality Date  . ARM WOUND REPAIR / CLOSURE     left  . EYE SURGERY     Lasik  . KNEE ARTHROSCOPY Left 1993  . TUBAL LIGATION  1997    No Known Allergies  Social History  Substance Use Topics  . Smoking status: Never Smoker  . Smokeless tobacco: Never Used  . Alcohol use No    Family History  Problem Relation Age of Onset  . Heart disease Mother   . Thyroid disease Mother   . Hypertension Mother   . Stroke Mother   . Heart disease Father   . Hypertension Father   . Emphysema Father   . Heart disease Sister   . Multiple myeloma Sister   . Heart disease Brother 83       heart stents X 2  . Thyroid disease Brother   . Cancer Paternal Grandmother        lung cancer  . Cancer Paternal Grandfather        prostate cancer  . Nephrolithiasis Daughter   . Hearing loss Daughter        cochlear implant age 54  . Heart disease Brother        Rheumatic Heart Disease  . Prostate cancer Paternal Uncle   . Breast cancer Maternal  Aunt        x2  . Colon cancer Neg Hx      Review of Systems  Constitutional: Negative.   HENT: Negative.   Eyes: Negative.   Respiratory: Negative.   Cardiovascular: Negative.   Gastrointestinal: Negative.   Genitourinary: Negative.   Musculoskeletal: Positive for joint pain.  Skin: Negative.   Neurological: Negative.   Endo/Heme/Allergies: Negative.   Psychiatric/Behavioral: Positive for depression. The patient is nervous/anxious.      Objective:   Physical Exam  Constitutional: She is well-developed, well-nourished, and in no distress.  Eyes: Pupils are equal, round, and reactive to light.  Neck: Neck supple. No JVD present. No tracheal deviation present. No thyromegaly present.  Cardiovascular: Normal rate, regular rhythm and intact distal pulses.   Pulmonary/Chest: Effort normal and breath sounds normal. No respiratory distress. She has no  wheezes.  Abdominal: Soft. There is no tenderness. There is no guarding.  Musculoskeletal:       Left knee: She exhibits decreased range of motion, swelling and bony tenderness. She exhibits no ecchymosis, no deformity, no laceration and no erythema. Tenderness found. Medial joint line tenderness noted. No lateral joint line tenderness noted.  Lymphadenopathy:    She has no cervical adenopathy.  Neurological: She is alert.  Skin: Skin is warm and dry.       Labs:  Estimated body mass index is 34.8 kg/m as calculated from the following:   Height as of 04/22/17: 5' 4.75" (1.645 m).   Weight as of 04/22/17: 94.1 kg (207 lb 8 oz).   Imaging Review Plain radiographs demonstrate severe degenerative joint disease of the left knee(s) medial compartment.  The bone quality appears to be good for age and reported activity level.  Assessment/Plan:  End stage arthritis, left knee medial compartment  The patient history, physical examination, clinical judgment of the provider and imaging studies are consistent with end stage degenerative joint disease of the left knee(s) and medial unicompartmental knee arthroplasty is deemed medically necessary. The treatment options including medical management, injection therapy arthroscopy and arthroplasty were discussed at length. The risks and benefits of total knee arthroplasty were presented and reviewed. The risks due to aseptic loosening, infection, stiffness, patella tracking problems, thromboembolic complications and other imponderables were discussed. The patient acknowledged the explanation, agreed to proceed with the plan and consent was signed. Patient is being admitted for outpatient / observation treatment for surgery, pain control, PT, OT, prophylactic antibiotics, VTE prophylaxis, progressive ambulation and ADL's and discharge planning. The patient is planning to be discharged home.      [place] Kayleen Alig   PA-C  04/30/2017, 11:06 AM

## 2017-05-02 ENCOUNTER — Other Ambulatory Visit (HOSPITAL_COMMUNITY): Payer: Self-pay | Admitting: Emergency Medicine

## 2017-05-02 NOTE — Patient Instructions (Signed)
Jillian Steele  05/02/2017   Your procedure is scheduled on: 05-12-17  Report to [facility] Main  Entrance   Report to admitting at 745AM   Call this number if you have problems the morning of surgery  [phone]   Remember: ONLY 1 PERSON MAY GO WITH YOU TO SHORT STAY TO GET  READY MORNING OF YOUR SURGERY.  Do not eat food or drink liquids :After Midnight.     Take these medicines the morning of surgery with A SIP OF WATER: xanax as needed                                You may not have any metal on your body including hair pins and              piercings  Do not wear jewelry, make-up, lotions, powders or perfumes, deodorant             Do not wear nail polish.  Do not shave  48 hours prior to surgery.            Do not bring valuables to the hospital. [place].  Contacts, dentures or bridgework may not be worn into surgery.  Leave suitcase in the car. After surgery it may be brought to your room.              Please read over the following fact sheets you were given: _____________________________________________________________________          [facility] - Preparing for Surgery Before surgery, you can play an important role.  Because skin is not sterile, your skin needs to be as free of germs as possible.  You can reduce the number of germs on your skin by washing with CHG (chlorahexidine gluconate) soap before surgery.  CHG is an antiseptic cleaner which kills germs and bonds with the skin to continue killing germs even after washing. Please DO NOT use if you have an allergy to CHG or antibacterial soaps.  If your skin becomes reddened/irritated stop using the CHG and inform your nurse when you arrive at Short Stay. Do not shave (including legs and underarms) for at least 48 hours prior to the first CHG shower.  You may shave your face/neck. Please follow these instructions carefully:  1.  Shower with CHG  Soap the night before surgery and the  morning of Surgery.  2.  If you choose to wash your hair, wash your hair first as usual with your  normal  shampoo.  3.  After you shampoo, rinse your hair and body thoroughly to remove the  shampoo.                           4.  Use CHG as you would any other liquid soap.  You can apply chg directly  to the skin and wash                       Gently with a scrungie or clean washcloth.  5.  Apply the CHG Soap to your body ONLY FROM THE NECK DOWN.   Do not use on face/ open  Wound or open sores. Avoid contact with eyes, ears mouth and genitals (private parts).                       Wash face,  Genitals (private parts) with your normal soap.             6.  Wash thoroughly, paying special attention to the area where your surgery  will be performed.  7.  Thoroughly rinse your body with warm water from the neck down.  8.  DO NOT shower/wash with your normal soap after using and rinsing off  the CHG Soap.                9.  Pat yourself dry with a clean towel.            10.  Wear clean pajamas.            11.  Place clean sheets on your bed the night of your first shower and do not  sleep with pets. Day of Surgery : Do not apply any lotions/deodorants the morning of surgery.  Please wear clean clothes to the hospital/surgery center.  FAILURE TO FOLLOW THESE INSTRUCTIONS MAY RESULT IN THE CANCELLATION OF YOUR SURGERY PATIENT SIGNATURE_________________________________  NURSE SIGNATURE__________________________________  ________________________________________________________________________   Adam Phenix  An incentive spirometer is a tool that can help keep your lungs clear and active. This tool measures how well you are filling your lungs with each breath. Taking long deep breaths may help reverse or decrease the chance of developing breathing (pulmonary) problems (especially infection) following:  A long period of time  when you are unable to move or be active. BEFORE THE PROCEDURE   If the spirometer includes an indicator to show your best effort, your nurse or respiratory therapist will set it to a desired goal.  If possible, sit up straight or lean slightly forward. Try not to slouch.  Hold the incentive spirometer in an upright position. INSTRUCTIONS FOR USE  1. Sit on the edge of your bed if possible, or sit up as far as you can in bed or on a chair. 2. Hold the incentive spirometer in an upright position. 3. Breathe out normally. 4. Place the mouthpiece in your mouth and seal your lips tightly around it. 5. Breathe in slowly and as deeply as possible, raising the piston or the ball toward the top of the column. 6. Hold your breath for 3-5 seconds or for as long as possible. Allow the piston or ball to fall to the bottom of the column. 7. Remove the mouthpiece from your mouth and breathe out normally. 8. Rest for a few seconds and repeat Steps 1 through 7 at least 10 times every 1-2 hours when you are awake. Take your time and take a few normal breaths between deep breaths. 9. The spirometer may include an indicator to show your best effort. Use the indicator as a goal to work toward during each repetition. 10. After each set of 10 deep breaths, practice coughing to be sure your lungs are clear. If you have an incision (the cut made at the time of surgery), support your incision when coughing by placing a pillow or rolled up towels firmly against it. Once you are able to get out of bed, walk around indoors and cough well. You may stop using the incentive spirometer when instructed by your caregiver.  RISKS AND COMPLICATIONS  Take your time so you do not get  dizzy or light-headed.  If you are in pain, you may need to take or ask for pain medication before doing incentive spirometry. It is harder to take a deep breath if you are having pain. AFTER USE  Rest and breathe slowly and easily.  It can be  helpful to keep track of a log of your progress. Your caregiver can provide you with a simple table to help with this. If you are using the spirometer at home, follow these instructions: [place] IF:   You are having difficultly using the spirometer.  You have trouble using the spirometer as often as instructed.  Your pain medication is not giving enough relief while using the spirometer.  You develop fever of 100.5 F (38.1 C) or higher. SEEK IMMEDIATE MEDICAL CARE IF:   You cough up bloody sputum that had not been present before.  You develop fever of 102 F (38.9 C) or greater.  You develop worsening pain at or near the incision site. MAKE SURE YOU:   Understand these instructions.  Will watch your condition.  Will get help right away if you are not doing well or get worse. Document Released: 02/10/2007 Document Revised: 12/23/2011 Document Reviewed: 04/13/2007 Exit[employer] Patient Information 2014 Exit[employer], [place].   ________________________________________________________________________  WHAT IS A BLOOD TRANSFUSION? Blood Transfusion Information  A transfusion is the replacement of blood or some of its parts. Blood is made up of multiple cells which provide different functions.  Red blood cells carry oxygen and are used for blood loss replacement.  White blood cells fight against infection.  Platelets control bleeding.  Plasma helps clot blood.  Other blood products are available for specialized needs, such as hemophilia or other clotting disorders. BEFORE THE TRANSFUSION  Who gives blood for transfusions?   Healthy volunteers who are fully evaluated to make sure their blood is safe. This is blood bank blood. Transfusion therapy is the safest it has ever been in the practice of medicine. Before blood is taken from a donor, a complete history is taken to make sure that person has no history of diseases nor engages in risky social behavior (examples are  intravenous drug use or sexual activity with multiple partners). The donor's travel history is screened to minimize risk of transmitting infections, such as malaria. The donated blood is tested for signs of infectious diseases, such as HIV and hepatitis. The blood is then tested to be sure it is compatible with you in order to minimize the chance of a transfusion reaction. If you or a relative donates blood, this is often done in anticipation of surgery and is not appropriate for emergency situations. It takes many days to process the donated blood. RISKS AND COMPLICATIONS Although transfusion therapy is very safe and saves many lives, the main dangers of transfusion include:   Getting an infectious disease.  Developing a transfusion reaction. This is an allergic reaction to something in the blood you were given. Every precaution is taken to prevent this. The decision to have a blood transfusion has been considered carefully by your caregiver before blood is given. Blood is not given unless the benefits outweigh the risks. AFTER THE TRANSFUSION  Right after receiving a blood transfusion, you will usually feel much better and more energetic. This is especially true if your red blood cells have gotten low (anemic). The transfusion raises the level of the red blood cells which carry oxygen, and this usually causes an energy increase.  The nurse administering the transfusion will  monitor you carefully for complications. HOME CARE INSTRUCTIONS  No special instructions are needed after a transfusion. You may find your energy is better. Speak with your caregiver about any limitations on activity for underlying diseases you may have. SEEK MEDICAL CARE IF:   Your condition is not improving after your transfusion.  You develop redness or irritation at the intravenous (IV) site. SEEK IMMEDIATE MEDICAL CARE IF:  Any of the following symptoms occur over the next 12 hours:  Shaking chills.  You have a  temperature by mouth above 102 F (38.9 C), not controlled by medicine.  Chest, back, or muscle pain.  People around you feel you are not acting correctly or are confused.  Shortness of breath or difficulty breathing.  Dizziness and fainting.  You get a rash or develop hives.  You have a decrease in urine output.  Your urine turns a dark color or changes to pink, red, or brown. Any of the following symptoms occur over the next 10 days:  You have a temperature by mouth above 102 F (38.9 C), not controlled by medicine.  Shortness of breath.  Weakness after normal activity.  The white part of the eye turns yellow (jaundice).  You have a decrease in the amount of urine or are urinating less often.  Your urine turns a dark color or changes to pink, red, or brown. Document Released: 09/27/2000 Document Revised: 12/23/2011 Document Reviewed: 05/16/2008 [facility] Patient Information 2014 [place], [place].  _______________________________________________________________________

## 2017-05-02 NOTE — Progress Notes (Signed)
LOV Dellis Filbert PA-C 04-22-17 epic writes  "Preoperative examination HTN continues to be stable. Will have labs drawn 05/05/2017. Surgical clearance given. Preoperative clearance form completed and returned to patient."  [place] cardiology Dr Sallyanne Kuster 05-24-16 epic : "She was only able to walk for just over 3 minutesOn a treadmill stress test. There were no ECG changes at rest or with exercise. Her echocardiogram showed reassuring findings with normal left ventricular systolic function although there was evidence of mild diastolic dysfunction, without signs of elevated filling pressure. Pulmonary function tests were completely normal." .Marland KitchenMarland Kitchen"Dyspnea:  Marked improvement in exercise tolerance after starting antihypertensive therapy. For now will defer right and left heart catheterization."  ECHO stress test 03-07-16 epic  [place] MD note reads : "Both the echo and the stress test were normal. However, her exercise capacity on the stress test was clearly very limited and her BP was high.  I think the symptoms may be due to a combination of deconditioning and untreated HTN"...." Recommend we try amlodipine 2.5 mg daily to start. "

## 2017-05-05 ENCOUNTER — Encounter (HOSPITAL_COMMUNITY): Payer: Self-pay

## 2017-05-05 ENCOUNTER — Encounter (HOSPITAL_COMMUNITY)
Admission: RE | Admit: 2017-05-05 | Discharge: 2017-05-05 | Disposition: A | Discharge status: Home / Self Care | Source: Ambulatory Visit | Attending: Orthopedic Surgery | Admitting: Orthopedic Surgery

## 2017-05-05 DIAGNOSIS — Z0183 Encounter for blood typing: Secondary | ICD-10-CM | POA: Insufficient documentation

## 2017-05-05 DIAGNOSIS — Z01818 Encounter for other preprocedural examination: Secondary | ICD-10-CM | POA: Diagnosis present

## 2017-05-05 DIAGNOSIS — I10 Essential (primary) hypertension: Secondary | ICD-10-CM | POA: Insufficient documentation

## 2017-05-05 DIAGNOSIS — M1712 Unilateral primary osteoarthritis, left knee: Secondary | ICD-10-CM | POA: Insufficient documentation

## 2017-05-05 DIAGNOSIS — R9431 Abnormal electrocardiogram [ECG] [EKG]: Secondary | ICD-10-CM | POA: Insufficient documentation

## 2017-05-05 DIAGNOSIS — Z01812 Encounter for preprocedural laboratory examination: Secondary | ICD-10-CM | POA: Insufficient documentation

## 2017-05-05 LAB — BASIC METABOLIC PANEL
Anion gap: 6 (ref 5–15)
BUN: 14 mg/dL (ref 6–20)
CALCIUM: 9.3 mg/dL (ref 8.9–10.3)
CHLORIDE: 105 mmol/L (ref 101–111)
CO2: 27 mmol/L (ref 22–32)
CREATININE: 0.9 mg/dL (ref 0.44–1.00)
GFR calc Af Amer: >60 mL/min (ref >60)
GFR calc non Af Amer: >60 mL/min (ref >60)
Glucose, Bld: 93 mg/dL (ref 65–99)
Potassium: 4.4 mmol/L (ref 3.5–5.1)
SODIUM: 138 mmol/L (ref 135–145)

## 2017-05-05 LAB — CBC
HEMATOCRIT: 39.6 % (ref 36.0–46.0)
HEMOGLOBIN: 13.2 g/dL (ref 12.0–15.0)
MCH: 28 pg (ref 26.0–34.0)
MCHC: 33.3 g/dL (ref 30.0–36.0)
MCV: 83.9 fL (ref 78.0–100.0)
Platelets: 239 10*3/uL (ref 150–400)
RBC: 4.72 MIL/uL (ref 3.87–5.11)
RDW: 14.3 % (ref 11.5–15.5)
WBC: 7.5 10*3/uL (ref 4.0–10.5)

## 2017-05-05 LAB — ABO/RH: ABO/RH(D): O POS

## 2017-05-05 LAB — SURGICAL PCR SCREEN
MRSA, PCR: NEGATIVE
Staphylococcus aureus: NEGATIVE

## 2017-05-12 ENCOUNTER — Ambulatory Visit (HOSPITAL_COMMUNITY): Admitting: Anesthesiology

## 2017-05-12 ENCOUNTER — Telehealth: Payer: Self-pay | Admitting: Obstetrics & Gynecology

## 2017-05-12 ENCOUNTER — Encounter (HOSPITAL_COMMUNITY)
Admission: RE | Disposition: A | Discharge status: Home / Self Care | Payer: Self-pay | Source: Ambulatory Visit | Attending: Orthopedic Surgery

## 2017-05-12 ENCOUNTER — Encounter (HOSPITAL_COMMUNITY): Payer: Self-pay

## 2017-05-12 ENCOUNTER — Observation Stay (HOSPITAL_COMMUNITY)
Admission: RE | Admit: 2017-05-12 | Discharge: 2017-05-13 | Disposition: A | Discharge status: Home / Self Care | Source: Ambulatory Visit | Attending: Orthopedic Surgery | Admitting: Orthopedic Surgery

## 2017-05-12 DIAGNOSIS — Z803 Family history of malignant neoplasm of breast: Secondary | ICD-10-CM | POA: Insufficient documentation

## 2017-05-12 DIAGNOSIS — Z8042 Family history of malignant neoplasm of prostate: Secondary | ICD-10-CM | POA: Diagnosis not present

## 2017-05-12 DIAGNOSIS — Z9851 Tubal ligation status: Secondary | ICD-10-CM | POA: Diagnosis not present

## 2017-05-12 DIAGNOSIS — Z8249 Family history of ischemic heart disease and other diseases of the circulatory system: Secondary | ICD-10-CM | POA: Diagnosis not present

## 2017-05-12 DIAGNOSIS — M1712 Unilateral primary osteoarthritis, left knee: Secondary | ICD-10-CM | POA: Diagnosis present

## 2017-05-12 DIAGNOSIS — Z8349 Family history of other endocrine, nutritional and metabolic diseases: Secondary | ICD-10-CM | POA: Insufficient documentation

## 2017-05-12 DIAGNOSIS — Z823 Family history of stroke: Secondary | ICD-10-CM | POA: Insufficient documentation

## 2017-05-12 DIAGNOSIS — Z836 Family history of other diseases of the respiratory system: Secondary | ICD-10-CM | POA: Insufficient documentation

## 2017-05-12 DIAGNOSIS — Z807 Family history of other malignant neoplasms of lymphoid, hematopoietic and related tissues: Secondary | ICD-10-CM | POA: Diagnosis not present

## 2017-05-12 DIAGNOSIS — Z9889 Other specified postprocedural states: Secondary | ICD-10-CM | POA: Insufficient documentation

## 2017-05-12 DIAGNOSIS — Z79899 Other long term (current) drug therapy: Secondary | ICD-10-CM | POA: Diagnosis not present

## 2017-05-12 DIAGNOSIS — Z801 Family history of malignant neoplasm of trachea, bronchus and lung: Secondary | ICD-10-CM | POA: Insufficient documentation

## 2017-05-12 DIAGNOSIS — F329 Major depressive disorder, single episode, unspecified: Secondary | ICD-10-CM | POA: Diagnosis not present

## 2017-05-12 DIAGNOSIS — I10 Essential (primary) hypertension: Secondary | ICD-10-CM | POA: Diagnosis not present

## 2017-05-12 DIAGNOSIS — F419 Anxiety disorder, unspecified: Secondary | ICD-10-CM | POA: Insufficient documentation

## 2017-05-12 DIAGNOSIS — Z96652 Presence of left artificial knee joint: Secondary | ICD-10-CM

## 2017-05-12 DIAGNOSIS — Z7982 Long term (current) use of aspirin: Secondary | ICD-10-CM | POA: Insufficient documentation

## 2017-05-12 HISTORY — PX: PARTIAL KNEE ARTHROPLASTY: SHX2174

## 2017-05-12 LAB — TYPE AND SCREEN
ABO/RH(D): O POS
Antibody Screen: NEGATIVE

## 2017-05-12 SURGERY — ARTHROPLASTY, KNEE, UNICOMPARTMENTAL
Anesthesia: Spinal | Laterality: Left

## 2017-05-12 MED ORDER — BUPIVACAINE IN DEXTROSE 0.75-8.25 % IT SOLN
INTRATHECAL | Status: DC | PRN
  Administered 2017-05-12: 2 mL via INTRATHECAL

## 2017-05-12 MED ORDER — BUPIVACAINE-EPINEPHRINE 0.25% -1:200000 IJ SOLN
INTRAMUSCULAR | Status: DC | PRN
  Administered 2017-05-12: 50 mL

## 2017-05-12 MED ORDER — ASPIRIN 81 MG PO CHEW: 81.0000 mg | CHEWABLE_TABLET | Freq: Two times a day (BID) | ORAL | 0 refills | Status: AC

## 2017-05-12 MED ORDER — PHENOL 1.4 % MT LIQD: 1.0000 | OROMUCOSAL | Status: DC | PRN

## 2017-05-12 MED ORDER — POLYETHYLENE GLYCOL 3350 17 G PO PACK: 17.0000 g | PACK | Freq: Two times a day (BID) | ORAL | 0 refills | Status: DC

## 2017-05-12 MED ORDER — EPHEDRINE SULFATE-NACL 50-0.9 MG/10ML-% IV SOSY
PREFILLED_SYRINGE | INTRAVENOUS | Status: DC | PRN
  Administered 2017-05-12: 10 mg via INTRAVENOUS
  Administered 2017-05-12: 5 mg via INTRAVENOUS
  Administered 2017-05-12: 10 mg via INTRAVENOUS
  Administered 2017-05-12: 5 mg via INTRAVENOUS

## 2017-05-12 MED ORDER — LACTATED RINGERS IV SOLN: INTRAVENOUS | Status: DC

## 2017-05-12 MED ORDER — ONDANSETRON HCL 4 MG/2ML IJ SOLN
INTRAMUSCULAR | Status: AC
Start: 2017-05-12 — End: 2017-05-12
  Filled 2017-05-12: qty 2

## 2017-05-12 MED ORDER — KETOROLAC TROMETHAMINE 30 MG/ML IJ SOLN
INTRAMUSCULAR | Status: AC
  Filled 2017-05-12: qty 1

## 2017-05-12 MED ORDER — KETOROLAC TROMETHAMINE 30 MG/ML IJ SOLN
INTRAMUSCULAR | Status: DC | PRN
  Administered 2017-05-12: 30 mg via INTRAVENOUS

## 2017-05-12 MED ORDER — PROPOFOL 10 MG/ML IV BOLUS
INTRAVENOUS | Status: AC
  Filled 2017-05-12: qty 20

## 2017-05-12 MED ORDER — PROPOFOL 500 MG/50ML IV EMUL
INTRAVENOUS | Status: DC | PRN
  Administered 2017-05-12: 30 mg via INTRAVENOUS

## 2017-05-12 MED ORDER — FENTANYL CITRATE (PF) 100 MCG/2ML IJ SOLN
50.0000 ug | INTRAMUSCULAR | Status: DC | PRN
  Administered 2017-05-12: 100 ug via INTRAVENOUS

## 2017-05-12 MED ORDER — CEFAZOLIN SODIUM-DEXTROSE 2-4 GM/100ML-% IV SOLN
INTRAVENOUS | Status: AC
  Filled 2017-05-12: qty 100

## 2017-05-12 MED ORDER — SODIUM CHLORIDE 0.9 % IJ SOLN
INTRAMUSCULAR | Status: AC
  Filled 2017-05-12: qty 50

## 2017-05-12 MED ORDER — ASPIRIN EC 325 MG PO TBEC
325.0000 mg | DELAYED_RELEASE_TABLET | Freq: Two times a day (BID) | ORAL | Status: DC
  Administered 2017-05-13: 325 mg via ORAL
  Filled 2017-05-12: qty 1

## 2017-05-12 MED ORDER — SODIUM CHLORIDE 0.9 % IJ SOLN
INTRAMUSCULAR | Status: AC
  Filled 2017-05-12: qty 10

## 2017-05-12 MED ORDER — BUPIVACAINE-EPINEPHRINE (PF) 0.5% -1:200000 IJ SOLN
INTRAMUSCULAR | Status: DC | PRN
  Administered 2017-05-12: 30 mL via PERINEURAL

## 2017-05-12 MED ORDER — MEPERIDINE HCL 50 MG/ML IJ SOLN: 6.2500 mg | INTRAMUSCULAR | Status: DC | PRN

## 2017-05-12 MED ORDER — FERROUS SULFATE 325 (65 FE) MG PO TABS: 325.0000 mg | ORAL_TABLET | Freq: Three times a day (TID) | ORAL | Status: DC

## 2017-05-12 MED ORDER — CELECOXIB 200 MG PO CAPS
200.0000 mg | ORAL_CAPSULE | Freq: Two times a day (BID) | ORAL | Status: DC
  Administered 2017-05-12 – 2017-05-13 (×2): 200 mg via ORAL
  Filled 2017-05-12 (×3): qty 1

## 2017-05-12 MED ORDER — MIDAZOLAM HCL 2 MG/2ML IJ SOLN
1.0000 mg | INTRAMUSCULAR | Status: DC | PRN
Start: 2017-05-12 — End: 2017-05-12
  Administered 2017-05-12: 1 mg via INTRAVENOUS
  Filled 2017-05-12: qty 2

## 2017-05-12 MED ORDER — ESCITALOPRAM OXALATE 20 MG PO TABS
20.0000 mg | ORAL_TABLET | Freq: Every day | ORAL | Status: DC
  Administered 2017-05-12: 20 mg via ORAL
  Filled 2017-05-12: qty 1

## 2017-05-12 MED ORDER — METHOCARBAMOL 500 MG PO TABS: 500.0000 mg | ORAL_TABLET | Freq: Four times a day (QID) | ORAL | 0 refills | Status: DC | PRN

## 2017-05-12 MED ORDER — CEFAZOLIN SODIUM-DEXTROSE 2-4 GM/100ML-% IV SOLN
2.0000 g | INTRAVENOUS | Status: AC
  Administered 2017-05-12: 2 g via INTRAVENOUS

## 2017-05-12 MED ORDER — ALPRAZOLAM 0.5 MG PO TABS: 0.5000 mg | ORAL_TABLET | Freq: Two times a day (BID) | ORAL | Status: DC | PRN

## 2017-05-12 MED ORDER — ONDANSETRON HCL 4 MG PO TABS: 4.0000 mg | ORAL_TABLET | Freq: Four times a day (QID) | ORAL | Status: DC | PRN

## 2017-05-12 MED ORDER — BUPROPION HCL ER (XL) 300 MG PO TB24
300.0000 mg | ORAL_TABLET | Freq: Every day | ORAL | Status: DC
  Administered 2017-05-12: 300 mg via ORAL
  Filled 2017-05-12: qty 1

## 2017-05-12 MED ORDER — BUPIVACAINE-EPINEPHRINE 0.25% -1:200000 IJ SOLN
INTRAMUSCULAR | Status: AC
  Filled 2017-05-12: qty 1

## 2017-05-12 MED ORDER — SODIUM CHLORIDE 0.9 % IV SOLN
INTRAVENOUS | Status: DC
  Administered 2017-05-12: 16:00:00 via INTRAVENOUS

## 2017-05-12 MED ORDER — AMLODIPINE BESYLATE 5 MG PO TABS
7.5000 mg | ORAL_TABLET | Freq: Every day | ORAL | Status: DC
  Administered 2017-05-12: 7.5 mg via ORAL
  Filled 2017-05-12: qty 2

## 2017-05-12 MED ORDER — PROPOFOL 10 MG/ML IV BOLUS
INTRAVENOUS | Status: AC
  Filled 2017-05-12: qty 40

## 2017-05-12 MED ORDER — ONDANSETRON HCL 4 MG/2ML IJ SOLN: 4.0000 mg | Freq: Four times a day (QID) | INTRAMUSCULAR | Status: DC | PRN

## 2017-05-12 MED ORDER — EPHEDRINE 5 MG/ML INJ
INTRAVENOUS | Status: AC
  Filled 2017-05-12: qty 10

## 2017-05-12 MED ORDER — POLYETHYLENE GLYCOL 3350 17 G PO PACK
17.0000 g | PACK | Freq: Two times a day (BID) | ORAL | Status: DC
  Administered 2017-05-12: 17 g via ORAL
  Filled 2017-05-12 (×2): qty 1

## 2017-05-12 MED ORDER — FENTANYL CITRATE (PF) 100 MCG/2ML IJ SOLN: 25.0000 ug | INTRAMUSCULAR | Status: DC | PRN

## 2017-05-12 MED ORDER — MAGNESIUM CITRATE PO SOLN: 1.0000 | Freq: Once | ORAL | Status: DC | PRN

## 2017-05-12 MED ORDER — BISACODYL 10 MG RE SUPP: 10.0000 mg | Freq: Every day | RECTAL | Status: DC | PRN

## 2017-05-12 MED ORDER — TRANEXAMIC ACID 1000 MG/10ML IV SOLN
1000.0000 mg | Freq: Once | INTRAVENOUS | Status: AC
  Administered 2017-05-12: 1000 mg via INTRAVENOUS
  Filled 2017-05-12: qty 1100

## 2017-05-12 MED ORDER — METHOCARBAMOL 500 MG PO TABS: 500.0000 mg | ORAL_TABLET | Freq: Four times a day (QID) | ORAL | Status: DC | PRN

## 2017-05-12 MED ORDER — METOCLOPRAMIDE HCL 5 MG PO TABS: 5.0000 mg | ORAL_TABLET | Freq: Three times a day (TID) | ORAL | Status: DC | PRN

## 2017-05-12 MED ORDER — ONDANSETRON HCL 4 MG/2ML IJ SOLN
INTRAMUSCULAR | Status: DC | PRN
  Administered 2017-05-12: 4 mg via INTRAVENOUS

## 2017-05-12 MED ORDER — HYDROCODONE-ACETAMINOPHEN 7.5-325 MG PO TABS
1.0000 | ORAL_TABLET | ORAL | Status: DC
  Administered 2017-05-12 – 2017-05-13 (×6): 1 via ORAL
  Filled 2017-05-12: qty 2
  Filled 2017-05-12 (×2): qty 1
  Filled 2017-05-12: qty 2
  Filled 2017-05-12 (×2): qty 1
  Filled 2017-05-12: qty 2

## 2017-05-12 MED ORDER — CHLORHEXIDINE GLUCONATE 4 % EX LIQD: 60.0000 mL | Freq: Once | CUTANEOUS | Status: DC

## 2017-05-12 MED ORDER — METOCLOPRAMIDE HCL 5 MG/ML IJ SOLN: 5.0000 mg | Freq: Three times a day (TID) | INTRAMUSCULAR | Status: DC | PRN

## 2017-05-12 MED ORDER — HYDROCODONE-ACETAMINOPHEN 7.5-325 MG PO TABS: 1.0000 | ORAL_TABLET | ORAL | 0 refills | Status: DC | PRN

## 2017-05-12 MED ORDER — FERROUS SULFATE 325 (65 FE) MG PO TABS
325.0000 mg | ORAL_TABLET | Freq: Three times a day (TID) | ORAL | Status: DC
  Administered 2017-05-13: 325 mg via ORAL
  Filled 2017-05-12 (×2): qty 1

## 2017-05-12 MED ORDER — DEXAMETHASONE SODIUM PHOSPHATE 10 MG/ML IJ SOLN
10.0000 mg | Freq: Once | INTRAMUSCULAR | Status: AC
  Administered 2017-05-13: 10 mg via INTRAVENOUS
  Filled 2017-05-12: qty 1

## 2017-05-12 MED ORDER — TRANEXAMIC ACID 1000 MG/10ML IV SOLN
1000.0000 mg | INTRAVENOUS | Status: AC
  Administered 2017-05-12: 1000 mg via INTRAVENOUS
  Filled 2017-05-12: qty 1100

## 2017-05-12 MED ORDER — FENTANYL CITRATE (PF) 100 MCG/2ML IJ SOLN
INTRAMUSCULAR | Status: DC | PRN
  Administered 2017-05-12: 100 ug via INTRAVENOUS

## 2017-05-12 MED ORDER — DEXAMETHASONE SODIUM PHOSPHATE 10 MG/ML IJ SOLN
INTRAMUSCULAR | Status: AC
  Filled 2017-05-12: qty 1

## 2017-05-12 MED ORDER — PROPOFOL 500 MG/50ML IV EMUL
INTRAVENOUS | Status: DC | PRN
  Administered 2017-05-12: 100 ug/kg/min via INTRAVENOUS

## 2017-05-12 MED ORDER — DIPHENHYDRAMINE HCL 25 MG PO CAPS: 25.0000 mg | ORAL_CAPSULE | Freq: Four times a day (QID) | ORAL | Status: DC | PRN

## 2017-05-12 MED ORDER — DEXAMETHASONE SODIUM PHOSPHATE 10 MG/ML IJ SOLN
INTRAMUSCULAR | Status: DC | PRN
  Administered 2017-05-12: 10 mg via INTRAVENOUS

## 2017-05-12 MED ORDER — DOCUSATE SODIUM 100 MG PO CAPS: 100.0000 mg | ORAL_CAPSULE | Freq: Two times a day (BID) | ORAL | 0 refills | Status: DC

## 2017-05-12 MED ORDER — DOCUSATE SODIUM 100 MG PO CAPS
100.0000 mg | ORAL_CAPSULE | Freq: Two times a day (BID) | ORAL | Status: DC
  Administered 2017-05-12 – 2017-05-13 (×2): 100 mg via ORAL
  Filled 2017-05-12 (×2): qty 1

## 2017-05-12 MED ORDER — FENTANYL CITRATE (PF) 100 MCG/2ML IJ SOLN
INTRAMUSCULAR | Status: AC
  Administered 2017-05-12: 100 ug via INTRAVENOUS
  Filled 2017-05-12: qty 2

## 2017-05-12 MED ORDER — MIDAZOLAM HCL 2 MG/2ML IJ SOLN
INTRAMUSCULAR | Status: AC
  Filled 2017-05-12: qty 2

## 2017-05-12 MED ORDER — METHOCARBAMOL 1000 MG/10ML IJ SOLN
500.0000 mg | Freq: Four times a day (QID) | INTRAMUSCULAR | Status: DC | PRN
  Administered 2017-05-12: 500 mg via INTRAVENOUS
  Filled 2017-05-12: qty 550

## 2017-05-12 MED ORDER — MIDAZOLAM HCL 5 MG/5ML IJ SOLN
INTRAMUSCULAR | Status: DC | PRN
  Administered 2017-05-12 (×2): 1 mg via INTRAVENOUS

## 2017-05-12 MED ORDER — ALUM & MAG HYDROXIDE-SIMETH 200-200-20 MG/5ML PO SUSP: 30.0000 mL | ORAL | Status: DC | PRN

## 2017-05-12 MED ORDER — METOCLOPRAMIDE HCL 5 MG/ML IJ SOLN: 10.0000 mg | Freq: Once | INTRAMUSCULAR | Status: DC | PRN

## 2017-05-12 MED ORDER — CEFAZOLIN SODIUM-DEXTROSE 2-4 GM/100ML-% IV SOLN
2.0000 g | Freq: Four times a day (QID) | INTRAVENOUS | Status: AC
  Administered 2017-05-12 – 2017-05-13 (×2): 2 g via INTRAVENOUS
  Filled 2017-05-12 (×2): qty 100

## 2017-05-12 MED ORDER — SODIUM CHLORIDE 0.9 % IJ SOLN
INTRAMUSCULAR | Status: DC | PRN
  Administered 2017-05-12: 50 mL via INTRAVENOUS

## 2017-05-12 MED ORDER — MENTHOL 3 MG MT LOZG: 1.0000 | LOZENGE | OROMUCOSAL | Status: DC | PRN

## 2017-05-12 MED ORDER — CALCIUM CARBONATE ANTACID 500 MG PO CHEW: 1.0000 | CHEWABLE_TABLET | Freq: Two times a day (BID) | ORAL | Status: DC | PRN

## 2017-05-12 MED ORDER — MIDAZOLAM HCL 2 MG/2ML IJ SOLN
INTRAMUSCULAR | Status: AC
  Administered 2017-05-12: 1 mg via INTRAVENOUS
  Filled 2017-05-12: qty 2

## 2017-05-12 MED ORDER — LACTATED RINGERS IV SOLN
INTRAVENOUS | Status: DC
  Administered 2017-05-12: 1000 mL via INTRAVENOUS
  Administered 2017-05-12: 12:00:00 via INTRAVENOUS

## 2017-05-12 MED ORDER — FENTANYL CITRATE (PF) 100 MCG/2ML IJ SOLN
INTRAMUSCULAR | Status: AC
  Filled 2017-05-12: qty 2

## 2017-05-12 SURGICAL SUPPLY — 44 items
ADH SKN CLS APL DERMABOND .7 (GAUZE/BANDAGES/DRESSINGS) ×1
BAG DECANTER FOR FLEXI CONT (MISCELLANEOUS) IMPLANT
BAG SPEC THK2 15X12 ZIP CLS (MISCELLANEOUS)
BAG ZIPLOCK 12X15 (MISCELLANEOUS) IMPLANT
BANDAGE ACE 6X5 VEL STRL LF (GAUZE/BANDAGES/DRESSINGS) ×2 IMPLANT
BLADE SAW RECIPROCATING 77.5 (BLADE) ×2 IMPLANT
BLADE SAW SGTL 13.0X1.19X90.0M (BLADE) ×2 IMPLANT
BOWL SMART MIX CTS (DISPOSABLE) ×2 IMPLANT
CAPT KNEE PARTIAL 2 ×1 IMPLANT
CEMENT HV SMART SET (Cement) ×1 IMPLANT
CLOTH BEACON ORANGE TIMEOUT ST (SAFETY) ×2 IMPLANT
COVER SURGICAL LIGHT HANDLE (MISCELLANEOUS) ×2 IMPLANT
CUFF TOURN SGL QUICK 34 (TOURNIQUET CUFF) ×2
CUFF TRNQT CYL 34X4X40X1 (TOURNIQUET CUFF) ×1 IMPLANT
DERMABOND ADVANCED (GAUZE/BANDAGES/DRESSINGS) ×1
DERMABOND ADVANCED .7 DNX12 (GAUZE/BANDAGES/DRESSINGS) ×1 IMPLANT
DRAPE U-SHAPE 47X51 STRL (DRAPES) ×2 IMPLANT
DRESSING AQUACEL AG SP 3.5X10 (GAUZE/BANDAGES/DRESSINGS) ×1 IMPLANT
DRSG AQUACEL AG ADV 3.5X 6 (GAUZE/BANDAGES/DRESSINGS) ×1 IMPLANT
DRSG AQUACEL AG SP 3.5X10 (GAUZE/BANDAGES/DRESSINGS) ×2
DURAPREP 26ML APPLICATOR (WOUND CARE) ×4 IMPLANT
ELECT REM PT RETURN 15FT ADLT (MISCELLANEOUS) ×2 IMPLANT
GLOVE BIOGEL M 7.0 STRL (GLOVE) ×2 IMPLANT
GLOVE BIOGEL PI IND STRL 7.5 (GLOVE) ×1 IMPLANT
GLOVE BIOGEL PI IND STRL 8.5 (GLOVE) ×1 IMPLANT
GLOVE BIOGEL PI INDICATOR 7.5 (GLOVE) ×1
GLOVE BIOGEL PI INDICATOR 8.5 (GLOVE) ×1
GLOVE ECLIPSE 8.0 STRL XLNG CF (GLOVE) ×2 IMPLANT
GLOVE ORTHO TXT STRL SZ7.5 (GLOVE) ×4 IMPLANT
GOWN STRL REUS W/TWL LRG LVL3 (GOWN DISPOSABLE) ×2 IMPLANT
GOWN STRL REUS W/TWL XL LVL3 (GOWN DISPOSABLE) ×2 IMPLANT
LEGGING LITHOTOMY PAIR STRL (DRAPES) ×2 IMPLANT
MANIFOLD NEPTUNE II (INSTRUMENTS) ×2 IMPLANT
PACK TOTAL KNEE CUSTOM (KITS) ×2 IMPLANT
SUCTION YANKAUER HANDLE (MISCELLANEOUS) ×1 IMPLANT
SUT MNCRL AB 4-0 PS2 18 (SUTURE) ×2 IMPLANT
SUT STRATAFIX 0 PDS 27 VIOLET (SUTURE) ×2
SUT VIC AB 1 CT1 36 (SUTURE) ×2 IMPLANT
SUT VIC AB 2-0 CT1 27 (SUTURE) ×4
SUT VIC AB 2-0 CT1 TAPERPNT 27 (SUTURE) ×2 IMPLANT
SUTURE STRATFX 0 PDS 27 VIOLET (SUTURE) ×1 IMPLANT
SYR 27GX1/2 1ML LL SAFETY (SYRINGE) ×1 IMPLANT
SYR 50ML LL SCALE MARK (SYRINGE) ×2 IMPLANT
TRAY FOLEY W/METER SILVER 16FR (SET/KITS/TRAYS/PACK) ×1 IMPLANT

## 2017-05-12 NOTE — Anesthesia Preprocedure Evaluation (Addendum)
Anesthesia Evaluation  Patient identified by MRN, date of birth, ID band Patient awake    Reviewed: Allergy & Precautions, NPO status , Patient's Chart, lab work & pertinent test results  Airway Mallampati: II  TM Distance: >3 FB Neck ROM: Full    Dental no notable dental hx.    Pulmonary neg pulmonary ROS,    Pulmonary exam normal breath sounds clear to auscultation       Cardiovascular hypertension, Pt. on medications Normal cardiovascular exam Rhythm:Regular Rate:Normal     Neuro/Psych negative neurological ROS  negative psych ROS   GI/Hepatic negative GI ROS, Neg liver ROS,   Endo/Other  negative endocrine ROS  Renal/GU negative Renal ROS  negative genitourinary   Musculoskeletal negative musculoskeletal ROS (+)   Abdominal   Peds negative pediatric ROS (+)  Hematology negative hematology ROS (+)   Anesthesia Other Findings   Reproductive/Obstetrics negative OB ROS                             Anesthesia Physical Anesthesia Plan  ASA: II  Anesthesia Plan: Spinal   Post-op Pain Management:    Induction:   PONV Risk Score and Plan: 2 and Ondansetron and Dexamethasone  Airway Management Planned: Simple Face Mask  Additional Equipment:   Intra-op Plan:   Post-operative Plan:   Informed Consent: I have reviewed the patients History and Physical, chart, labs and discussed the procedure including the risks, benefits and alternatives for the proposed anesthesia with the patient or authorized representative who has indicated his/her understanding and acceptance.   Dental advisory given  Plan Discussed with: CRNA  Anesthesia Plan Comments:         Anesthesia Quick Evaluation

## 2017-05-12 NOTE — Telephone Encounter (Signed)
Left message for patient to call to reschedule her [address] appointment.

## 2017-05-12 NOTE — Transfer of Care (Signed)
Immediate Anesthesia Transfer of Care Note  Patient: Jillian Steele  Procedure(s) Performed: Procedure(s) with comments: UNICOMPARTMENTAL KNEE, Medially (Left) - 90 mins  Patient Location: PACU  Anesthesia Type:Spinal  Level of Consciousness: awake, alert  and oriented  Airway & Oxygen Therapy: Patient Spontanous Breathing and Patient connected to nasal cannula oxygen  Post-op Assessment: Report given to RN and Post -op Vital signs reviewed and stable  Post vital signs: Reviewed and stable  Last Vitals:  Vitals:   05/12/17 1127 05/12/17 1128  BP:    Pulse: 86 79  Resp: 16 19  Temp:      Last Pain:  Vitals:   05/12/17 0801  TempSrc: Oral      Patients Stated Pain Goal: 4 (67/01/41 0301)  Complications: No apparent anesthesia complications

## 2017-05-12 NOTE — Anesthesia Postprocedure Evaluation (Signed)
Anesthesia Post Note  Patient: Falyn Rubel  Procedure(s) Performed: Procedure(s) (LRB): UNICOMPARTMENTAL KNEE, Medially (Left)     Patient location during evaluation: PACU Anesthesia Type: Spinal and Regional Level of consciousness: awake and alert Pain management: pain level controlled Vital Signs Assessment: post-procedure vital signs reviewed and stable Respiratory status: spontaneous breathing and respiratory function stable Cardiovascular status: blood pressure returned to baseline and stable Postop Assessment: no headache, no backache and spinal receding Anesthetic complications: no    Last Vitals:  Vitals:   05/12/17 1613 05/12/17 1711  BP: 136/68 128/65  Pulse: 90 88  Resp: 16 15  Temp: 36.5 C 36.8 C    Last Pain:  Vitals:   05/12/17 1711  TempSrc: Oral                 Montez Hageman

## 2017-05-12 NOTE — Anesthesia Procedure Notes (Signed)
Spinal  Patient location during procedure: OR Start time: 05/12/2017 11:39 AM End time: 05/12/2017 11:45 AM Staffing Anesthesiologist: Montez Hageman Resident/CRNA: Chrystine Oiler G Performed: resident/CRNA  Preanesthetic Checklist Completed: patient identified, site marked, surgical consent, pre-op evaluation, timeout performed, IV checked, risks and benefits discussed and monitors and equipment checked Spinal Block Patient position: sitting Prep: DuraPrep Patient monitoring: heart rate, continuous pulse ox and blood pressure Approach: midline Location: L2-3 Injection technique: single-shot Needle Needle type: Pencan  Needle gauge: 24 G Needle length: 10 cm Needle insertion depth: 6 cm Assessment Sensory level: T6 Additional Notes Kit date checked and verified. -heme,-paraesthesia,+CSF pre and post injection. Patient tolerated well.

## 2017-05-12 NOTE — Interval H&P Note (Signed)
History and Physical Interval Note:  05/12/2017 10:20 AM  Jillian Steele  has presented today for surgery, with the diagnosis of Left knee osteoarthritis, medially  The various methods of treatment have been discussed with the patient and family. After consideration of risks, benefits and other options for treatment, the patient has consented to  Procedure(s) with comments: UNICOMPARTMENTAL KNEE, Medially (Left) - 90 mins as a surgical intervention .  The patient's history has been reviewed, patient examined, no change in status, stable for surgery.  I have reviewed the patient's chart and labs.  Questions were answered to the patient's satisfaction.     Mauri Pole

## 2017-05-12 NOTE — Anesthesia Procedure Notes (Signed)
Anesthesia Regional Block: Adductor canal block   Pre-Anesthetic Checklist: ,, timeout performed, Correct Patient, Correct Site, Correct Laterality, Correct Procedure, Correct Position, site marked, Risks and benefits discussed,  Surgical consent,  Pre-op evaluation,  At surgeon's request and post-op pain management  Laterality: Left and Lower  Prep: Maximum Sterile Barrier Precautions used, chloraprep       Needles:  Injection technique: Single-shot  Needle Type: Echogenic Stimulator Needle     Needle Length: 10cm      Additional Needles:   Procedures: ultrasound guided,,,,,,,,  Narrative:  Start time: 05/12/2017 9:13 AM End time: 05/12/2017 9:18 AM Injection made incrementally with aspirations every 5 mL.  Performed by: Personally  Anesthesiologist: Montez Hageman  Additional Notes: Risks, benefits and alternative to block explained extensively.  Patient tolerated procedure well, without complications.

## 2017-05-12 NOTE — Anesthesia Procedure Notes (Addendum)
Date/Time: 05/12/2017 11:39 AM Performed by: Glory Buff Oxygen Delivery Method: Nasal cannula

## 2017-05-12 NOTE — Op Note (Signed)
NAME: Jillian Steele    MEDICAL RECORD NO.: [id number]   FACILITY: [facility] BIRTH: Apr 25, 1961  PHYSICIAN: Pietro Cassis. Alvan Dame, M.D.    DATE OF PROCEDURE: 05/12/2017    OPERATIVE REPORT   PREOPERATIVE DIAGNOSIS: Left knee medial compartment osteoarthritis.   POSTOPERATIVE DIAGNOSIS: Left knee medial compartment osteoarthritis.  PROCEDURE: Left partial knee replacement utilizing Biomet Oxford knee  component, size X-small femur, a left medial size AA tibial tray with a size 4 insert.   SURGEON: Pietro Cassis. Alvan Dame, M.D.   ASSISTANT: Danae Orleans, PAC.  Please note that Mr. Jillian Steele was present for the entirety of the case,  utilized for preoperative positioning, perioperative retractor  management, general facilitation of the case and primary wound closure.   ANESTHESIA: Regional plus spinal blocks.   SPECIMENS: None.   COMPLICATIONS: None.  DRAINS: None   TOURNIQUET TIME: 29 minutes at 250 mmHg.   INDICATIONS FOR PROCEDURE: The patient is a 56 y.o. patient of mine who presented for evaluation of left knee pain.  They presented with primary complaints of pain on the medial side of their knee. Radiographs revealed advanced medial compartment arthritis with specifically an antero-medial wear pattern.  There was bone on bone changes noted with subchondral sclerosis and osteophytes present. The patient has had progressive problems failing to respond to conservative measures of medications, injections and activity modification. Risks of infection, DVT, component failure, need for future revision surgery were all discussed and reviewed.  Consent was obtained for benefit of pain relief.   PROCEDURE IN DETAIL: The patient was brought to the operative theater.  Once adequate anesthesia, preoperative antibiotics, 2 gm Ancef, 1 gm of Tranexamic Acid, and 10 mg of Decadron administered, the patient was positioned in supine position with a left thigh tourniquet  placed. The left lower extremity  was prepped and draped in sterile  fashion with the leg on the Oxford leg holder.  The leg was allowed to flex to 120 degrees. A time-out  was performed identifying the patient, planned procedure, and extremity.  The leg was exsanguinated, tourniquet elevated to 250 mmHg. A midline  incision was made from the proximal pole of the patella to the tibial tubercle. A  soft tissue plane was created and partial median arthrotomy was then  made to allow for subluxation of the patella. Following initial synovectomy and  debridement, the osteophytes were removed off the medial aspect of the  knee.   Attention was first directed to the tibia. After sizing the femur to be an extra small, the extra small femoral spoon was kept in place and the tibial  extramedullary guide was positioned over the anterior crest of the tibia  and pinned into position, and using the G clamp resection guide from the  Oxford system, a 4 mm resection was made off the proximal tibia. First  the reciprocating saw along the medial aspect of the tibial spines, then the oscillating saw.    At this point, I sized this cut surface seem to be best fit for a size AA tibial tray.  With the retractors out of the wound and the knee held at 90 degrees the 4 feeler gauge had appropriate tension on the medial ligament.   At this point, the femoral canal was opened with a drill and the  intramedullary rod passed. Then using the guide for a X-small resection off  the posterior aspect of the femur was positioned over the mid portion of the medial femoral condyle.  The  orientation was set using the guide that mates the femoral guide to the intramedullary rod.  The 2 drill holes were made into the distal femur.  The posterior guide was then impacted into place and the posterior  femoral cut made.  At this point, I milled the distal femur with a size 4 spigot in place. At this point, we did a trial reduction of the X-small femur, size AA tibial  tray and a 4 feeler gauge. At 20 degrees of  flexion the tension on the MCL felt better with the 3 feeler gauge versus the 4 at 90 degrees. Given the difference in the tension between the knee in 90 degrees versus that in 20 degrees I had to place the 5 spigot into the femur and re-mill the distal femur.  Remaining bone was removed and debrided.  I repeated the trial reduction and found that now at both 90 degrees and 20 degrees the knee ligament were tension symmetrically with the 4 feeler gauge  Given these findings, the trial femoral component was removed. Final preparation of tibia was carried out by pinning it in position. Then  using a reciprocating saw I removed bone for the keel. Further bone was  removed with an osteotome.  Trial reduction was now carried out with the x-small femur, the keeled left AA tibia, and the size 4 lollipop insert. The balance of the  ligaments appeared to be symmetric at 20 degrees and 90 degrees. Given  all these findings, the trial components were removed.   Cement was mixed. The final components were opened. The knee was irrigated with  normal saline solution. Then final debridements of the  soft tissue was carried out, I also drilled the sclerotic bone with a drill.  The final components were cemented with a single batch of cement in a  two-stage technique with the tibial component cemented first. The knee  was then brought  to 45 degrees of flexion with a size 4 feeler gauge, held with pressure for a minute and half.  After this the femoral component was cemented in place.  The knee was again held at 45 degrees of flexion while the cement fully cured.  Excess cement was removed throughout the knee. Tourniquet was let down  after 29 minutes. After the cement had fully cured and excessive cement  was removed throughout the knee there was no visualized cement present.   The final left medial 4 insert to match the x-small femur was chosen and snapped into  position. We re-irrigated  the knee. The extensor mechanism  was then reapproximated using a #1 Vicryl with the knee in flexion and #0 V-lock suture. The  remaining wound was closed with 2-0 Vicryl and a running 4-0 Monocryl.  The knee was cleaned, dried, and dressed sterilely using Dermabond and  Aquacel dressing. The patient  was brought to the recovery room, Ace wrap in place, tolerating the  procedure well. She will be in the hospital for overnight observation.  We will initiate physical therapy and progress to ambulate.     Pietro Cassis Alvan Dame, M.D.

## 2017-05-13 ENCOUNTER — Encounter (HOSPITAL_COMMUNITY): Payer: Self-pay | Admitting: Orthopedic Surgery

## 2017-05-13 DIAGNOSIS — M1712 Unilateral primary osteoarthritis, left knee: Secondary | ICD-10-CM | POA: Diagnosis not present

## 2017-05-13 LAB — BASIC METABOLIC PANEL
ANION GAP: 8 (ref 5–15)
BUN: 12 mg/dL (ref 6–20)
CO2: 25 mmol/L (ref 22–32)
Calcium: 9.1 mg/dL (ref 8.9–10.3)
Chloride: 106 mmol/L (ref 101–111)
Creatinine, Ser: 0.75 mg/dL (ref 0.44–1.00)
GFR calc Af Amer: >60 mL/min (ref >60)
GFR calc non Af Amer: >60 mL/min (ref >60)
GLUCOSE: 126 mg/dL — AB (ref 65–99)
POTASSIUM: 4.6 mmol/L (ref 3.5–5.1)
Sodium: 139 mmol/L (ref 135–145)

## 2017-05-13 LAB — CBC
HEMATOCRIT: 38.1 % (ref 36.0–46.0)
Hemoglobin: 12.5 g/dL (ref 12.0–15.0)
MCH: 27.8 pg (ref 26.0–34.0)
MCHC: 32.8 g/dL (ref 30.0–36.0)
MCV: 84.9 fL (ref 78.0–100.0)
Platelets: 218 10*3/uL (ref 150–400)
RBC: 4.49 MIL/uL (ref 3.87–5.11)
RDW: 14.4 % (ref 11.5–15.5)
WBC: 12.8 10*3/uL — AB (ref 4.0–10.5)

## 2017-05-13 MED ORDER — ALUM & MAG HYDROXIDE-SIMETH 200-200-20 MG/5ML PO SUSP: 30.0000 mL | ORAL | Status: DC | PRN

## 2017-05-13 NOTE — Discharge Instructions (Signed)

## 2017-05-13 NOTE — Discharge Summary (Signed)
Physician Discharge Summary  Patient ID: Jillian Steele MRN: [id number] DOB/AGE: Aug 30, 1961 56 y.o.  Admit date: 05/12/2017 Discharge date: 05/13/2017   Procedures:  Procedure(s) (LRB): UNICOMPARTMENTAL KNEE, Medially (Left)  Attending Physician:  Dr. Paralee Cancel   Admission Diagnoses:   Left knee medial compartmental primary OA /pain  Discharge Diagnoses:  Principal Problem:   S/P left UKR  Past Medical History:  Diagnosis Date  . Abnormal Pap smear of cervix    hx cryotherapy to cervix in her early 20s--paps normal since  . Anxiety   . Depression   . Fibroid 11/2013   fundal - 2 1/2 cm sac mucosal  . Hormone replacement therapy (postmenopausal) 11/12/2011  . Hypertension     HPI:    Jillian Steele, 56 y.o. female female, has a history of pain and functional disability in the left and has failed non-surgical conservative treatments for greater than 12 weeks to include NSAID's and/or analgesics, corticosteriod injections and activity modification.  Onset of symptoms was gradual, starting 3+ years ago with gradually worsening course since that time. The patient noted prior procedures on the knee to include  arthroscopy and menisectomy on the left knee(s).  Patient currently rates pain in the left knee(s) at 8 out of 10 with activity. Patient has worsening of pain with activity and weight bearing, pain that interferes with activities of daily living, pain with passive range of motion and crepitus.  Patient has evidence of periarticular osteophytes and joint space narrowing of the medial compartment by imaging studies.  There is no active infection.  Risks, benefits and expectations were discussed with the patient.  Risks including but not limited to the risk of anesthesia, blood clots, nerve damage, blood vessel damage, failure of the prosthesis, infection and up to and including death.  Patient understand the risks, benefits and expectations and wishes to proceed with surgery.   PCP:  Harrison Mons, PA-C   Discharged Condition: good  Hospital Course:  Patient underwent the above stated procedure on 05/12/2017. Patient tolerated the procedure well and brought to the recovery room in good condition and subsequently to the floor.  POD #1 BP: 119/68 ; Pulse: 64 ; Temp: 97.7 F (36.5 C) ; Resp: 15 Patient reports pain as mild but not much activity at this point.  No events, ready to get going. Neurovascular intact and incision: dressing C/D/I.  LABS  Basename    HGB     12.5  HCT     38.1    Discharge Exam: General appearance: alert, cooperative and no distress Extremities: Homans sign is negative, no sign of DVT, no edema, redness or tenderness in the calves or thighs and no ulcers, gangrene or trophic changes  Disposition: Home with follow up in 2 weeks   Follow-up Information    Paralee Cancel, MD. Schedule an appointment as soon as possible for a visit in 2 week(s).   Specialty:  Orthopedic Surgery Contact information: [address] [place] [address] [phone]           Discharge Instructions    Call MD / Call 911    Complete by:  As directed    If you experience chest pain or shortness of breath, CALL 911 and be transported to the hospital emergency room.  If you develope a fever above 101 F, pus (white drainage) or increased drainage or redness at the wound, or calf pain, call your surgeon's office.   Change dressing    Complete by:  As directed    Maintain surgical dressing until follow up in the clinic. If the edges start to pull up, may reinforce with tape. If the dressing is no longer working, may remove and cover with gauze and tape, but must keep the area dry and clean.  Call with any questions or concerns.   Constipation Prevention    Complete by:  As directed    Drink plenty of fluids.  Prune juice may be helpful.  You may use a stool softener, such as Colace (over the counter) 100 mg twice a day.  Use MiraLax (over  the counter) for constipation as needed.   Diet - low sodium heart healthy    Complete by:  As directed    Discharge instructions    Complete by:  As directed    Maintain surgical dressing until follow up in the clinic. If the edges start to pull up, may reinforce with tape. If the dressing is no longer working, may remove and cover with gauze and tape, but must keep the area dry and clean.  Follow up in 2 weeks at [facility]. Call with any questions or concerns.   Increase activity slowly as tolerated    Complete by:  As directed    Weight bearing as tolerated with assist device (walker, cane, etc) as directed, use it as long as suggested by your surgeon or therapist, typically at least 4-6 weeks.   TED hose    Complete by:  As directed    Use stockings (TED hose) for 2 weeks on both leg(s).  You may remove them at night for sleeping.      Allergies as of 05/13/2017   No Known Allergies     Medication List    STOP taking these medications   ibuprofen 200 MG tablet Commonly known as:  ADVIL,MOTRIN     TAKE these medications   ALPRAZolam 0.5 MG tablet Commonly known as:  XANAX TAKE 1 TABLET BY MOUTH EVERY DAY AS NEEDED ANXIETY   amLODipine 5 MG tablet Commonly known as:  NORVASC Take 1.5 tablets (7.5 mg total) by mouth daily. What changed:  when to take this   aspirin 81 MG chewable tablet Chew 1 tablet (81 mg total) by mouth 2 (two) times daily. Take for 4 weeks.   buPROPion 300 MG 24 hr tablet Commonly known as:  WELLBUTRIN XL Take 1 tablet (300 mg total) by mouth daily. What changed:  when to take this   calcium carbonate 750 MG chewable tablet Commonly known as:  TUMS EX Chew 1 tablet by mouth 2 (two) times daily.   docusate sodium 100 MG capsule Commonly known as:  COLACE Take 1 capsule (100 mg total) by mouth 2 (two) times daily.   escitalopram 20 MG tablet Commonly known as:  LEXAPRO TAKE 1 TABLET (20 MG TOTAL) BY MOUTH DAILY. What changed:   when to take this   ferrous sulfate 325 (65 FE) MG tablet Commonly known as:  FERROUSUL Take 1 tablet (325 mg total) by mouth 3 (three) times daily with meals.   HYDROcodone-acetaminophen 7.5-325 MG tablet Commonly known as:  NORCO Take 1-2 tablets by mouth every 4 (four) hours as needed for moderate pain or severe pain.   methocarbamol 500 MG tablet Commonly known as:  ROBAXIN Take 1 tablet (500 mg total) by mouth every 6 (six) hours as needed for muscle spasms.   polyethylene glycol packet Commonly known as:  MIRALAX / GLYCOLAX Take 17 g by  mouth 2 (two) times daily.   Vitamin D (Ergocalciferol) 50000 units Caps capsule Commonly known as:  DRISDOL Take 1 capsule (50,000 Units total) by mouth every 7 (seven) days. What changed:  when to take this        Signed: West Pugh. Ikenna Ohms   PA-C  05/13/2017, 2:42 PM

## 2017-05-13 NOTE — Progress Notes (Signed)
Physical Therapy Treatment Patient Details Name: Jillian Steele MRN: [id number] DOB: [birth year] Today's Date: 05/13/2017    History of Present Illness 56 yo female s/p L UKR 05/12/17    PT Comments    2nd session to practice stair negotiation. Issued HEP for pt to perform 2x/day until she begins OPPT. All education completed. Ready to d/c from PT standpoint-pt stated RN already went over d/c instructions so let NT know pt was finished with PT.     Follow Up Recommendations  DC plan and follow up therapy as arranged by surgeon (OP)     Equipment Recommendations  None recommended by PT    Recommendations for Other Services       Precautions / Restrictions Precautions Precautions: Fall;Knee Restrictions Weight Bearing Restrictions: No RLE Weight Bearing: Weight bearing as tolerated    Mobility  Bed Mobility          General bed mobility comments: oob in recliner  Transfers Overall transfer level: Needs assistance Equipment used: Rolling walker (2 wheeled) Transfers: Sit to/from Stand Sit to Stand: Supervision         General transfer comment: for safety  Ambulation/Gait Ambulation/Gait assistance: Min guard Ambulation Distance (Feet): 60 Feet Assistive device: Rolling walker (2 wheeled) Gait Pattern/deviations: Step-to pattern     General Gait Details: close guard for safety. VCs safety, sequence.    Stairs Stairs: Yes   Stair Management: Backwards;With walker;Step to pattern Number of Stairs: 2 General stair comments: VCs safety, technique, sequence. Husband present to observe and assist with stabilizing walker.   Wheelchair Mobility    Modified Rankin (Stroke Patients Only)       Balance                                            Cognition Arousal/Alertness: Awake/alert Behavior During Therapy: WFL for tasks assessed/performed Overall Cognitive Status: Within Functional Limits for tasks assessed                                         Exercises    General Comments        Pertinent Vitals/Pain Pain Assessment: 0-10 Pain Score: 4  Pain Location: L knee Pain Descriptors / Indicators: Sore;Aching Pain Intervention(s): Monitored during session    Home Living Family/patient expects to be discharged to:: Private residence Living Arrangements: Spouse/significant other Available Help at Discharge: Family Type of Home: House Home Access: Stairs to enter Entrance Stairs-Rails: None Home Layout: One level Home Equipment: Environmental consultant - 2 wheels      Prior Function Level of Independence: Independent          PT Goals (current goals can now be found in the care plan section) Acute Rehab PT Goals Patient Stated Goal: regain PLOF PT Goal Formulation: With patient Time For Goal Achievement: 05/27/17 Potential to Achieve Goals: Good Progress towards PT goals: Progressing toward goals    Frequency    7X/week      PT Plan Current plan remains appropriate    Co-evaluation              AM-PAC PT "6 Clicks" Daily Activity  Outcome Measure  Difficulty turning over in bed (including adjusting bedclothes, sheets and blankets)?: A Little Difficulty moving from lying on back to sitting on the  side of the bed? : A Little Difficulty sitting down on and standing up from a chair with arms (e.g., wheelchair, bedside commode, etc,.)?: A Little Help needed moving to and from a bed to chair (including a wheelchair)?: A Little Help needed walking in hospital room?: A Little Help needed climbing 3-5 steps with a railing? : A Little 6 Click Score: 18    End of Session Equipment Utilized During Treatment: Gait belt Activity Tolerance: Patient tolerated treatment well Patient left: in chair;with call bell/phone within reach;with family/visitor present   PT Visit Diagnosis: Muscle weakness (generalized) (M62.81);Difficulty in walking, not elsewhere classified (R26.2)     Time:  1657-9038 PT Time Calculation (min) (ACUTE ONLY): 9 min  Charges:  $Gait Training: 8-22 mins                    G Codes:  Functional Assessment Tool Used: AM-PAC 6 Clicks Basic Mobility;Clinical judgement Functional Limitation: Mobility: Walking and moving around Mobility: Walking and Moving Around Current Status (B3383): At least 1 percent but less than 20 percent impaired, limited or restricted Mobility: Walking and Moving Around Goal Status [phone]): At least 1 percent but less than 20 percent impaired, limited or restricted      Weston Anna, MPT Pager: [phone]

## 2017-05-13 NOTE — Evaluation (Signed)
Physical Therapy Evaluation Patient Details Name: Jillian Steele MRN: [id number] DOB: [birth year] Today's Date: 05/13/2017   History of Present Illness  56 yo female s/p L UKR 05/12/17  Clinical Impression  On eval, pt was Min guard assist for mobility. She walked ~75 feet with a RW. Minimal pain with activity. Will plan to have a 2nd session to practice stair negotiation prior to d/c later today    Follow Up Recommendations DC plan and follow up therapy as arranged by surgeon (OP)    Equipment Recommendations  None recommended by PT    Recommendations for Other Services       Precautions / Restrictions Precautions Precautions: Knee;Fall Restrictions Weight Bearing Restrictions: No RLE Weight Bearing: Weight bearing as tolerated      Mobility  Bed Mobility Overal bed mobility: Needs Assistance Bed Mobility: Supine to Sit     Supine to sit: Supervision;HOB elevated        Transfers Overall transfer level: Needs assistance Equipment used: Rolling walker (2 wheeled) Transfers: Sit to/from Stand Sit to Stand: Min guard         General transfer comment: close guard for safety. VCs safety, hand/LE placement.   Ambulation/Gait Ambulation/Gait assistance: Min guard Ambulation Distance (Feet): 75 Feet Assistive device: Rolling walker (2 wheeled) Gait Pattern/deviations: Step-to pattern;Antalgic     General Gait Details: close guard for safety. VCs safety, sequence.   Stairs            Wheelchair Mobility    Modified Rankin (Stroke Patients Only)       Balance                                             Pertinent Vitals/Pain Pain Assessment: 0-10 Pain Score: 3  Pain Location: L knee, R knee Pain Descriptors / Indicators: Sore;Aching Pain Intervention(s): Monitored during session;Repositioned;Ice applied    Home Living Family/patient expects to be discharged to:: Private residence Living Arrangements: Spouse/significant  other Available Help at Discharge: Family Type of Home: House Home Access: Stairs to enter Entrance Stairs-Rails: None Technical brewer of Steps: 3 Home Layout: One level Home Equipment: Environmental consultant - 2 wheels      Prior Function Level of Independence: Independent               Hand Dominance        Extremity/Trunk Assessment   Upper Extremity Assessment Upper Extremity Assessment: Defer to OT evaluation    Lower Extremity Assessment Lower Extremity Assessment: Generalized weakness (s/p L UKR)    Cervical / Trunk Assessment Cervical / Trunk Assessment: Normal  Communication   Communication: No difficulties  Cognition Arousal/Alertness: Awake/alert Behavior During Therapy: WFL for tasks assessed/performed Overall Cognitive Status: Within Functional Limits for tasks assessed                                        General Comments      Exercises Total Joint Exercises Ankle Circles/Pumps: AROM;Both;10 reps;Supine Quad Sets: AROM;Both;10 reps;Supine Hip ABduction/ADduction: AROM;Left;10 reps;AAROM;Supine Straight Leg Raises: AROM;10 reps;Left;Supine Knee Flexion: AAROM;Left;10 reps;Seated Goniometric ROM: ~5-80 degrees   Assessment/Plan    PT Assessment Patient needs continued PT services  PT Problem List Decreased strength;Decreased mobility;Decreased range of motion;Decreased activity tolerance;Decreased balance;Decreased knowledge of use of DME;Pain;Decreased knowledge of precautions  PT Treatment Interventions DME instruction;Gait training;Therapeutic exercise;Therapeutic activities;Patient/family education;Balance training;Stair training;Functional mobility training    PT Goals (Current goals can be found in the Care Plan section)  Acute Rehab PT Goals Patient Stated Goal: regain PLOF PT Goal Formulation: With patient Time For Goal Achievement: 05/27/17 Potential to Achieve Goals: Good    Frequency 7X/week   Barriers to  discharge        Co-evaluation               AM-PAC PT "6 Clicks" Daily Activity  Outcome Measure Difficulty turning over in bed (including adjusting bedclothes, sheets and blankets)?: A Little Difficulty moving from lying on back to sitting on the side of the bed? : A Little Difficulty sitting down on and standing up from a chair with arms (e.g., wheelchair, bedside commode, etc,.)?: A Little Help needed moving to and from a bed to chair (including a wheelchair)?: A Little Help needed walking in hospital room?: A Little Help needed climbing 3-5 steps with a railing? : A Little 6 Click Score: 18    End of Session Equipment Utilized During Treatment: Gait belt Activity Tolerance: Patient tolerated treatment well Patient left: in chair;with call bell/phone within reach;with family/visitor present   PT Visit Diagnosis: Muscle weakness (generalized) (M62.81);Difficulty in walking, not elsewhere classified (R26.2)    Time: 9244-6286 PT Time Calculation (min) (ACUTE ONLY): 20 min   Charges:   PT Evaluation $PT Eval Low Complexity: 1 Low     PT G Codes:   PT G-Codes **NOT FOR INPATIENT CLASS** Functional Assessment Tool Used: AM-PAC 6 Clicks Basic Mobility;Clinical judgement Functional Limitation: Mobility: Walking and moving around Mobility: Walking and Moving Around Current Status (N8177): At least 1 percent but less than 20 percent impaired, limited or restricted Mobility: Walking and Moving Around Goal Status [phone]): At least 1 percent but less than 20 percent impaired, limited or restricted      Weston Anna, MPT Pager: [phone]

## 2017-05-13 NOTE — Evaluation (Signed)
Occupational Therapy Evaluation Patient Details Name: Jillian Steele MRN: [id number] DOB: 1961-04-27 Today's Date: 05/13/2017    History of Present Illness 56 yo female s/p L UKR 05/12/17   Clinical Impression  OT education complete.  No further OT needed           Precautions / Restrictions Precautions Precautions: Knee;Fall Restrictions Weight Bearing Restrictions: No RLE Weight Bearing: Weight bearing as tolerated      Mobility Bed Mobility Overal bed mobility: Needs Assistance Bed Mobility: Supine to Sit     Supine to sit: Supervision;HOB elevated        Transfers Overall transfer level: Needs assistance Equipment used: Rolling walker (2 wheeled) Transfers: Sit to/from Stand Sit to Stand: Min guard         General transfer comment: close guard for safety. VCs safety, hand/LE placement.         ADL either performed or assessed with clinical judgement   ADL Overall ADL's : Needs assistance/impaired     Grooming: Standing;Supervision/safety   Upper Body Bathing: Set up;Sitting   Lower Body Bathing: Minimal assistance;Sit to/from stand;Cueing for sequencing;Cueing for safety;With caregiver independent assisting   Upper Body Dressing : Set up;Sitting   Lower Body Dressing: Minimal assistance;Sit to/from stand;Cueing for safety;Cueing for sequencing;With caregiver independent assisting   Toilet Transfer: Supervision/safety;RW;Ambulation   Toileting- Water quality scientist and Hygiene: Supervision/safety;Sit to/from stand;Cueing for safety   Tub/ Shower Transfer: Copy Details (indicate cue type and reason): verbalized technique and understanding Functional mobility during ADLs: Supervision/safety;Cueing for sequencing;Caregiver able to provide necessary level of assistance                    Pertinent Vitals/Pain Pain Assessment: 0-10 Pain Score: 2  Pain Location: L knee Pain Descriptors / Indicators:  Sore;Aching Pain Intervention(s): Limited activity within patient's tolerance     Hand Dominance     Extremity/Trunk Assessment Upper Extremity Assessment Upper Extremity Assessment: Overall WFL for tasks assessed   Lower Extremity Assessment Lower Extremity Assessment: Generalized weakness (s/p L UKR)   Cervical / Trunk Assessment Cervical / Trunk Assessment: Normal   Communication Communication Communication: No difficulties   Cognition Arousal/Alertness: Awake/alert Behavior During Therapy: WFL for tasks assessed/performed Overall Cognitive Status: Within Functional Limits for tasks assessed                                                Home Living Family/patient expects to be discharged to:: Private residence Living Arrangements: Spouse/significant other Available Help at Discharge: Family Type of Home: House Home Access: Stairs to enter Technical brewer of Steps: 3 Entrance Stairs-Rails: None Home Layout: One level     Bathroom Shower/Tub: Walk-in shower         Home Equipment: Environmental consultant - 2 wheels          Prior Functioning/Environment Level of Independence: Independent                          OT Goals(Current goals can be found in the care plan section) Acute Rehab OT Goals Patient Stated Goal: regain PLOF  OT Frequency:      End of Session    Activity Tolerance:   Patient left:  Time: 7129-2909 OT Time Calculation (min): 15 min Charges:  OT General Charges $OT Visit: 1 Procedure G-Codes:     Kari Baars, OT [phone]  Payton Mccallum D 05/13/2017, 1:03 PM

## 2017-05-13 NOTE — Progress Notes (Signed)
Discharge planning, no HH needs identified. Got script for DME preop, plan for OP PT. [phone]

## 2017-05-13 NOTE — Progress Notes (Signed)
Patient ID: Jillian Steele, female   DOB: 12/31/1960, 56 y.o.   MRN: [id number] Subjective: 1 Day Post-Op Procedure(s) (LRB): UNICOMPARTMENTAL KNEE, Medially (Left)    Patient reports pain as mild but not much activity at this point.  No events, ready to get going  Objective:   VITALS:   Vitals:   05/13/17 0232 05/13/17 0500  BP: 116/69 119/68  Pulse: 65 64  Resp: 13 15  Temp: 98 F (36.7 C) 97.7 F (36.5 C)    Neurovascular intact Incision: dressing C/D/I  LABS  Recent Labs  05/13/17 0534  HGB 12.5  HCT 38.1  WBC 12.8*  PLT 218     Recent Labs  05/13/17 0534  NA 139  K 4.6  BUN 12  CREATININE 0.75  GLUCOSE 126*    No results for input(s): LABPT, INR in the last 72 hours.   Assessment/Plan: 1 Day Post-Op Procedure(s) (LRB): UNICOMPARTMENTAL KNEE, Medially (Left)   Advance diet Up with therapy  Home after therapy Reviewed goals Out pt PT set up already Aspirin for DVT proph

## 2017-08-21 ENCOUNTER — Other Ambulatory Visit: Payer: Self-pay | Admitting: Physician Assistant

## 2017-08-21 DIAGNOSIS — F329 Major depressive disorder, single episode, unspecified: Secondary | ICD-10-CM

## 2017-08-21 DIAGNOSIS — F419 Anxiety disorder, unspecified: Secondary | ICD-10-CM

## 2017-08-21 DIAGNOSIS — F32A Depression, unspecified: Secondary | ICD-10-CM

## 2017-08-21 NOTE — Telephone Encounter (Signed)
Please advise 

## 2017-08-21 NOTE — Telephone Encounter (Signed)
Meds ordered this encounter  Medications  . escitalopram (LEXAPRO) 20 MG tablet    Sig: TAKE ONE TABLET BY MOUTH ONCE DAILY    Dispense:  90 tablet    Refill:  0

## 2017-10-02 ENCOUNTER — Other Ambulatory Visit: Payer: Self-pay | Admitting: *Deleted

## 2017-10-02 MED ORDER — VITAMIN D (ERGOCALCIFEROL) 1.25 MG (50000 UNIT) PO CAPS: 50000.0000 [IU] | ORAL_CAPSULE | ORAL | 1 refills | Status: DC

## 2017-10-02 NOTE — Telephone Encounter (Signed)
Medication refill request: vitamin d  Last AEX:  01/22/17 PG  Next AEX: not yet scheduled  Last MMG (if hormonal medication request): 06/12/16 BIRADS 2 benign  Refill authorized: 01/24/17 #30, 0RF. Today, please advise.

## 2017-11-13 ENCOUNTER — Other Ambulatory Visit: Payer: Self-pay | Admitting: Physician Assistant

## 2017-11-13 DIAGNOSIS — F32A Depression, unspecified: Secondary | ICD-10-CM

## 2017-11-13 DIAGNOSIS — F419 Anxiety disorder, unspecified: Secondary | ICD-10-CM

## 2017-11-13 DIAGNOSIS — F329 Major depressive disorder, single episode, unspecified: Secondary | ICD-10-CM

## 2017-11-13 NOTE — Telephone Encounter (Signed)
Lexapro & Wellbutrin XL refill request Last OV 10/22/16 [id number], [place].

## 2018-01-07 ENCOUNTER — Other Ambulatory Visit: Payer: Self-pay

## 2018-01-07 ENCOUNTER — Encounter: Payer: Self-pay | Admitting: Physician Assistant

## 2018-01-07 ENCOUNTER — Ambulatory Visit (INDEPENDENT_AMBULATORY_CARE_PROVIDER_SITE_OTHER): Admitting: Physician Assistant

## 2018-01-07 VITALS — BP 134/90 | HR 93 | Temp 98.5°F | Resp 16 | Ht 65.0 in | Wt 210.2 lb

## 2018-01-07 DIAGNOSIS — Z6832 Body mass index (BMI) 32.0-32.9, adult: Secondary | ICD-10-CM | POA: Diagnosis not present

## 2018-01-07 DIAGNOSIS — R630 Anorexia: Secondary | ICD-10-CM | POA: Diagnosis not present

## 2018-01-07 DIAGNOSIS — E559 Vitamin D deficiency, unspecified: Secondary | ICD-10-CM

## 2018-01-07 DIAGNOSIS — R5383 Other fatigue: Secondary | ICD-10-CM | POA: Diagnosis not present

## 2018-01-07 DIAGNOSIS — I10 Essential (primary) hypertension: Secondary | ICD-10-CM | POA: Diagnosis not present

## 2018-01-07 DIAGNOSIS — F419 Anxiety disorder, unspecified: Secondary | ICD-10-CM | POA: Diagnosis not present

## 2018-01-07 DIAGNOSIS — R739 Hyperglycemia, unspecified: Secondary | ICD-10-CM

## 2018-01-07 DIAGNOSIS — F32A Depression, unspecified: Secondary | ICD-10-CM

## 2018-01-07 DIAGNOSIS — F329 Major depressive disorder, single episode, unspecified: Secondary | ICD-10-CM

## 2018-01-07 MED ORDER — ESCITALOPRAM OXALATE 20 MG PO TABS: 20.0000 mg | ORAL_TABLET | Freq: Every day | ORAL | 3 refills | Status: DC

## 2018-01-07 MED ORDER — BUPROPION HCL ER (XL) 450 MG PO TB24: 450.0000 mg | ORAL_TABLET | Freq: Every day | ORAL | 3 refills | Status: DC

## 2018-01-07 MED ORDER — VITAMIN D (ERGOCALCIFEROL) 1.25 MG (50000 UNIT) PO CAPS: 50000.0000 [IU] | ORAL_CAPSULE | ORAL | 3 refills | Status: DC

## 2018-01-07 MED ORDER — AMLODIPINE BESYLATE 5 MG PO TABS: 7.5000 mg | ORAL_TABLET | Freq: Every day | ORAL | 3 refills | Status: AC

## 2018-01-07 MED ORDER — ALPRAZOLAM 0.5 MG PO TABS: ORAL_TABLET | ORAL | 0 refills | Status: AC

## 2018-01-07 NOTE — Patient Instructions (Signed)
     IF you received an x-ray today, you will receive an invoice from [facility]. Please contact [facility] at [phone] with questions or concerns regarding your invoice.   IF you received labwork today, you will receive an invoice from [facility]. Please contact [facility] at [phone] with questions or concerns regarding your invoice.   Our billing staff will not be able to assist you with questions regarding bills from these companies.  You will be contacted with the lab results as soon as they are available. The fastest way to get your results is to activate your My Chart account. Instructions are located on the last page of this paperwork. If you have not heard from us regarding the results in 2 weeks, please contact this office.     

## 2018-01-07 NOTE — Progress Notes (Signed)
Patient ID: Jillian Steele, female    DOB: 1961/03/08, 57 y.o.   MRN: [id number]  PCP: Jillian Mons, PA-C  Chief Complaint  Patient presents with  . Medication Refill    ALL meds    Subjective:   Presents for evaluation of HTN and depression.  I feel like I'm just here. I'm not in a crying stage. I can't say I'm sad. I just don't have any get up and go. All is good in the world of work, since [employer] not.  Feels discarded, like she isn't needed. Feels terribly anxious again if she thinks about returning to work.  Sleeping well. Poor appetite. Eats out of necessity. Doesn't eat more than a couple of bites. Had an episode of severe indigestion, but that has resolved. Took Nexium with good results. Despite not eating, has gained weight. Has been going to the gym, trying to stay active, since her surgery.  For fun, goes antiquing. 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 Occasion, gets together with friends, most of them work. A friend that she used to get together with often seems to have pulled away.   Her daughter [name] got a cochlear implant, and is now working at [employer]. Doesn't see her as much. Her other daughter is working, so the patient cares for her dog.  Marriage is good. Her husband has some medical issues being evaluated. He has been diagnosed with OSA and is getting CPAP set up.   Review of Systems As above    Patient Active Problem List   Diagnosis Date Noted  . 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 Depression 03/26/2016  . BMI 32.0-32.9,adult 02/23/2016  . High blood pressure 02/23/2016  . Anxiety 11/12/2011     Prior to Admission medications   Medication Sig Start Date End Date Taking? Authorizing Provider  ALPRAZolam Duanne Moron) 0.5 MG tablet TAKE 1 TABLET BY MOUTH EVERY DAY AS NEEDED ANXIETY 10/22/16  Yes Kasmira Cacioppo, PA-C  amLODipine (NORVASC) 5 MG tablet Take 1.5 tablets (7.5 mg total) by mouth daily. Patient taking differently: Take 7.5 mg by  mouth at bedtime.  04/22/17  Yes Garrin Kirwan, PA-C  buPROPion (WELLBUTRIN XL) 300 MG 24 hr tablet TAKE ONE TABLET BY MOUTH DAILY 11/14/17  Yes Terrilee Dudzik, PA-C  calcium carbonate (TUMS EX) 750 MG chewable tablet Chew 1 tablet by mouth 2 (two) times daily.   Yes [provider]  escitalopram (LEXAPRO) 20 MG tablet TAKE ONE TABLET BY MOUTH ONCE DAILY 11/14/17  Yes Jeannifer Drakeford, PA-C  HYDROcodone-acetaminophen (NORCO) 7.5-325 MG tablet Take 1-2 tablets by mouth every 4 (four) hours as needed for moderate pain or severe pain. 05/12/17  Yes Babish, Rodman Key, PA-C  Vitamin D, Ergocalciferol, (DRISDOL) 50000 units CAPS capsule Take 1 capsule (50,000 Units total) by mouth every 7 (seven) days. 10/02/17  Yes Megan Salon, MD  docusate sodium (COLACE) 100 MG capsule Take 1 capsule (100 mg total) by mouth 2 (two) times daily. Patient not taking: Reported on 01/07/2018 05/12/17   Danae Orleans, PA-C  ferrous sulfate (FERROUSUL) 325 (65 FE) MG tablet Take 1 tablet (325 mg total) by mouth 3 (three) times daily with meals. Patient not taking: Reported on 01/07/2018 05/12/17   Danae Orleans, PA-C  methocarbamol (ROBAXIN) 500 MG tablet Take 1 tablet (500 mg total) by mouth every 6 (six) hours as needed for muscle spasms. Patient not taking: Reported on 01/07/2018 05/12/17   Danae Orleans, PA-C  polyethylene glycol (MIRALAX / Floria Raveling)  packet Take 17 g by mouth 2 (two) times daily. Patient not taking: Reported on 01/07/2018 05/12/17   Danae Orleans, PA-C     No Known Allergies     Objective:  Physical Exam  Constitutional: She is oriented to person, place, and time. She appears well-developed and well-nourished. She is active and cooperative. No distress.  BP 134/90   Pulse 93   Temp 98.5 F (36.9 C)   Resp 16   Ht 5\' 5"  (1.651 m)   Wt 210 lb 3.2 oz (95.3 kg)   LMP 10/14/2010 (Approximate)   SpO2 99%   BMI 34.98 kg/m   HENT:  Head: Normocephalic and atraumatic.  Right Ear: Hearing  normal.  Left Ear: Hearing normal.  Eyes: Conjunctivae are normal. No scleral icterus.  Neck: Normal range of motion. Neck supple. No thyromegaly present.  Cardiovascular: Normal rate, regular rhythm and normal heart sounds.  Pulses:      Radial pulses are 2+ on the right side, and 2+ on the left side.  Pulmonary/Chest: Effort normal and breath sounds normal.  Lymphadenopathy:       Head (right side): No tonsillar, no preauricular, no posterior auricular and no occipital adenopathy present.       Head (left side): No tonsillar, no preauricular, no posterior auricular and no occipital adenopathy present.    She has no cervical adenopathy.       Right: No supraclavicular adenopathy present.       Left: No supraclavicular adenopathy present.  Neurological: She is alert and oriented to person, place, and time. No sensory deficit.  Skin: Skin is warm, dry and intact. No rash noted. No cyanosis or erythema. Nails show no clubbing.  Psychiatric: She has a normal mood and affect. Her speech is normal and behavior is normal.    Wt Readings from Last 3 Encounters:  01/07/18 210 lb 3.2 oz (95.3 kg)  05/12/17 206 lb (93.4 kg)  05/05/17 206 lb 9.6 oz (93.7 kg)        Assessment & Plan:   Problem List Items Addressed This Visit    Anxiety    Primarily related to thoughts of returning to work, and is unlikely to return at this point. COntinue current treatment with escitalopram and increase bupropion to address increased depressive symptoms. Continue PRN alprazolam.      Relevant Medications   escitalopram (LEXAPRO) 20 MG tablet   buPROPion 450 MG TB24   ALPRAZolam (XANAX) 0.5 MG tablet   BMI 32.0-32.9,adult    Weight is up 4 lbs since 04/2018. Increase exercise: frequency, duration, intensity. Encourage her to do this with a partner, to also increase social interaction, which will contribute to improved mood.      High blood pressure - Primary    Borderline control, but has been out of  amlodipine for several days. Resume amlodipine.      Relevant Medications   amLODipine (NORVASC) 5 MG tablet   Other Relevant Orders   CBC with Differential/Platelet (Completed)   Comprehensive metabolic panel (Completed)   Depression    Uncontrolled. INCREASE bupropion from 300 to 450 mg. CONTINUE escitalopram and PRN alprazolam.      Relevant Medications   escitalopram (LEXAPRO) 20 MG tablet   buPROPion 450 MG TB24   ALPRAZolam (XANAX) 0.5 MG tablet   Other Relevant Orders   Comprehensive metabolic panel (Completed)   TSH (Completed)   Vitamin D deficiency    Update lab today. Adjust regimen as indicated.  Relevant Medications   Vitamin D, Ergocalciferol, (DRISDOL) 50000 units CAPS capsule   Other Relevant Orders   VITAMIN D 25 Hydroxy (Vit-D Deficiency, Fractures) (Completed)    Other Visit Diagnoses    Fatigue, unspecified type       Relevant Orders   CBC with Differential/Platelet (Completed)   Comprehensive metabolic panel (Completed)   TSH (Completed)   Loss of appetite       Relevant Orders   Comprehensive metabolic panel (Completed)   TSH (Completed)   Hyperglycemia       Relevant Orders   Comprehensive metabolic panel (Completed)   Hemoglobin A1c (Completed)       Return in about 1 month (around 02/04/2018) for re-evaluation of mood.   Fara Chute, PA-C [facility] [place]

## 2018-01-08 LAB — CBC WITH DIFFERENTIAL/PLATELET
BASOS: 0 %
Basophils Absolute: 0 10*3/uL (ref 0.0–0.2)
EOS (ABSOLUTE): 0.1 10*3/uL (ref 0.0–0.4)
Eos: 1 %
HEMOGLOBIN: 12.7 g/dL (ref 11.1–15.9)
Hematocrit: 37.8 % (ref 34.0–46.6)
IMMATURE GRANS (ABS): 0 10*3/uL (ref 0.0–0.1)
Immature Granulocytes: 0 %
LYMPHS: 30 %
Lymphocytes Absolute: 2.9 10*3/uL (ref 0.7–3.1)
MCH: 27 pg (ref 26.6–33.0)
MCHC: 33.6 g/dL (ref 31.5–35.7)
MCV: 80 fL (ref 79–97)
Monocytes Absolute: 0.6 10*3/uL (ref 0.1–0.9)
Monocytes: 6 %
NEUTROS ABS: 5.9 10*3/uL (ref 1.4–7.0)
Neutrophils: 63 %
PLATELETS: 273 10*3/uL (ref 150–379)
RBC: 4.7 x10E6/uL (ref 3.77–5.28)
RDW: 15 % (ref 12.3–15.4)
WBC: 9.6 10*3/uL (ref 3.4–10.8)

## 2018-01-08 LAB — COMPREHENSIVE METABOLIC PANEL
A/G RATIO: 1.8 (ref 1.2–2.2)
ALBUMIN: 4.4 g/dL (ref 3.5–5.5)
ALT: 16 IU/L (ref 0–32)
AST: 11 IU/L (ref 0–40)
Alkaline Phosphatase: 116 IU/L (ref 39–117)
BILIRUBIN TOTAL: 0.3 mg/dL (ref 0.0–1.2)
BUN / CREAT RATIO: 13 (ref 9–23)
BUN: 12 mg/dL (ref 6–24)
CHLORIDE: 104 mmol/L (ref 96–106)
CO2: 25 mmol/L (ref 20–29)
Calcium: 9.6 mg/dL (ref 8.7–10.2)
Creatinine, Ser: 0.9 mg/dL (ref 0.57–1.00)
GFR calc non Af Amer: 71 mL/min/{1.73_m2} (ref >59)
GFR, EST AFRICAN AMERICAN: 82 mL/min/{1.73_m2} (ref >59)
GLOBULIN, TOTAL: 2.5 g/dL (ref 1.5–4.5)
Glucose: 99 mg/dL (ref 65–99)
POTASSIUM: 4 mmol/L (ref 3.5–5.2)
SODIUM: 142 mmol/L (ref 134–144)
TOTAL PROTEIN: 6.9 g/dL (ref 6.0–8.5)

## 2018-01-08 LAB — TSH: TSH: 4.13 u[IU]/mL (ref 0.450–4.500)

## 2018-01-08 LAB — HEMOGLOBIN A1C
Est. average glucose Bld gHb Est-mCnc: 97 mg/dL
Hgb A1c MFr Bld: 5 % (ref 4.8–5.6)

## 2018-01-08 LAB — VITAMIN D 25 HYDROXY (VIT D DEFICIENCY, FRACTURES): Vit D, 25-Hydroxy: 33.8 ng/mL (ref 30.0–100.0)

## 2018-01-08 NOTE — Assessment & Plan Note (Signed)
Uncontrolled. INCREASE bupropion from 300 to 450 mg. CONTINUE escitalopram and PRN alprazolam.

## 2018-01-08 NOTE — Assessment & Plan Note (Signed)
Weight is up 4 lbs since 04/2018. Increase exercise: frequency, duration, intensity. Encourage her to do this with a partner, to also increase social interaction, which will contribute to improved mood.

## 2018-01-08 NOTE — Assessment & Plan Note (Signed)
Primarily related to thoughts of returning to work, and is unlikely to return at this point. COntinue current treatment with escitalopram and increase bupropion to address increased depressive symptoms. Continue PRN alprazolam.

## 2018-01-08 NOTE — Assessment & Plan Note (Signed)
Update lab today. Adjust regimen as indicated.

## 2018-01-08 NOTE — Assessment & Plan Note (Signed)
Borderline control, but has been out of amlodipine for several days. Resume amlodipine.

## 2018-01-19 ENCOUNTER — Encounter: Payer: Self-pay | Admitting: Physician Assistant

## 2018-01-27 ENCOUNTER — Ambulatory Visit: Admitting: Nurse Practitioner

## 2018-02-10 ENCOUNTER — Encounter: Payer: Self-pay | Admitting: Physician Assistant

## 2018-02-10 ENCOUNTER — Ambulatory Visit (INDEPENDENT_AMBULATORY_CARE_PROVIDER_SITE_OTHER): Admitting: Physician Assistant

## 2018-02-10 DIAGNOSIS — F32A Depression, unspecified: Secondary | ICD-10-CM

## 2018-02-10 DIAGNOSIS — F329 Major depressive disorder, single episode, unspecified: Secondary | ICD-10-CM

## 2018-02-10 MED ORDER — BUPROPION HCL ER (XL) 150 MG PO TB24: 450.0000 mg | ORAL_TABLET | Freq: Every day | ORAL | 3 refills | Status: DC

## 2018-02-10 NOTE — Progress Notes (Signed)
Patient ID: Jillian Steele, female    DOB: 14-Nov-1960, 57 y.o.   MRN: [id number]  PCP: Harrison Mons, PA-C  Chief Complaint  Patient presents with  . Depression    follow up     Subjective:   Presents for evaluation of depression.  I just saw her 3 days ago, at which time she reported loss of motivation, lack of value and increased anxiety. She was advised to continue escitalopram and PRN alprazolam, and INCREASE bupropion from 300 mg to 450 mg.    Review of Systems  Depression screen [facility] 2/9 02/10/2018 01/07/2018 04/22/2017 10/22/2016 07/02/2016  Decreased Interest 0 0 0 0 0  Down, Depressed, Hopeless 0 0 0 0 0  PHQ - 2 Score 0 0 0 0 0  Altered sleeping 3 - - - -  Tired, decreased energy 1 - - - -  Change in appetite 1 - - - -  Feeling bad or failure about yourself  0 - - - -  Trouble concentrating 0 - - - -  Moving slowly or fidgety/restless 0 - - - -  Suicidal thoughts 0 - - - -  PHQ-9 Score 5 - - - -  Difficult doing work/chores Not difficult at all - - - -      Patient Active Problem List   Diagnosis Date Noted  . Vitamin D deficiency 01/07/2018  . 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 Depression 03/26/2016  . BMI 32.0-32.9,adult 02/23/2016  . High blood pressure 02/23/2016  . Anxiety 11/12/2011     Prior to Admission medications   Medication Sig Start Date Authorizing Provider  ALPRAZolam Duanne Moron) 0.5 MG tablet TAKE 1 TABLET BY MOUTH EVERY DAY AS NEEDED ANXIETY 01/07/18 Jacqulynn Cadet, Doreather Hoxworth, PA-C  amLODipine (NORVASC) 5 MG tablet Take 1.5 tablets (7.5 mg total) by mouth daily. 01/07/18 Harrison Mons, PA-C  buPROPion 450 MG TB24 Take 450 mg by mouth daily. 01/07/18 Harrison Mons, PA-C  calcium carbonate (TUMS EX) 750 MG chewable tablet Chew 1 tablet by mouth 2 (two) times daily.  [provider]  escitalopram (LEXAPRO) 20 MG tablet Take 1 tablet (20 mg total) by mouth daily. 01/07/18 Harrison Mons, PA-C  Vitamin D, Ergocalciferol, (DRISDOL) 50000 units CAPS  capsule Take 1 capsule (50,000 Units total) by mouth every 7 (seven) days. 01/07/18 Harrison Mons, PA-C     No Known Allergies     Objective:  Physical Exam  Constitutional: She is oriented to person, place, and time. She appears well-developed and well-nourished. She is active and cooperative. No distress.  BP 112/80   Pulse 88   Temp 98.8 F (37.1 C)   Resp 16   Ht 5\' 5"  (1.651 m)   Wt 208 lb 12.8 oz (94.7 kg)   LMP 10/14/2010 (Approximate)   SpO2 95%   BMI 34.75 kg/m   HENT:  Head: Normocephalic and atraumatic.  Right Ear: Hearing normal.  Left Ear: Hearing normal.  Eyes: Conjunctivae are normal. No scleral icterus.  Neck: Normal range of motion. Neck supple. No thyromegaly present.  Cardiovascular: Normal rate, regular rhythm and normal heart sounds.  Pulses:      Radial pulses are 2+ on the right side, and 2+ on the left side.  Pulmonary/Chest: Effort normal and breath sounds normal.  Lymphadenopathy:       Head (right side): No tonsillar, no preauricular, no posterior auricular and no occipital adenopathy present.       Head (left side): No tonsillar, no preauricular, no posterior  auricular and no occipital adenopathy present.    She has no cervical adenopathy.       Right: No supraclavicular adenopathy present.       Left: No supraclavicular adenopathy present.  Neurological: She is alert and oriented to person, place, and time. No sensory deficit.  Skin: Skin is warm, dry and intact. No rash noted. No cyanosis or erythema. Nails show no clubbing.  Psychiatric: She has a normal mood and affect. Her speech is normal and behavior is normal.           Assessment & Plan:   Problem List Items Addressed This Visit    Depression    Tolerating increased bupropion dose. Continue current regimen. Consider counseling. Consider looking for work, even volunteer, that would help her feel useful/valued.      Relevant Medications   buPROPion (WELLBUTRIN XL) 150 MG 24 hr  tablet       Return in about 3 months (around 05/12/2018) for re-evaluation of depression.   Fara Chute, PA-C [facility] [place]

## 2018-02-10 NOTE — Patient Instructions (Addendum)
Just for curiosity, look at jobs that are out there. Not for the purpose of pursuing anything, just to see.    IF you received an x-ray today, you will receive an invoice from [facility]. Please contact [facility] at [phone] with questions or concerns regarding your invoice.   IF you received labwork today, you will receive an invoice from [facility]. Please contact [facility] at [phone] with questions or concerns regarding your invoice.   Our billing staff will not be able to assist you with questions regarding bills from these companies.  You will be contacted with the lab results as soon as they are available. The fastest way to get your results is to activate your My Chart account. Instructions are located on the last page of this paperwork. If you have not heard from [place] regarding the results in 2 weeks, please contact this office.

## 2018-03-08 NOTE — Assessment & Plan Note (Signed)
Tolerating increased bupropion dose. Continue current regimen. Consider counseling. Consider looking for work, even volunteer, that would help her feel useful/valued.

## 2018-04-04 ENCOUNTER — Other Ambulatory Visit: Payer: Self-pay | Admitting: Obstetrics & Gynecology

## 2018-05-14 ENCOUNTER — Encounter: Payer: Self-pay | Admitting: Gastroenterology

## 2018-06-22 ENCOUNTER — Emergency Department (HOSPITAL_BASED_OUTPATIENT_CLINIC_OR_DEPARTMENT_OTHER)
Admission: EM | Admit: 2018-06-22 | Discharge: 2018-06-22 | Disposition: A | Discharge status: Home / Self Care | Attending: Emergency Medicine | Admitting: Emergency Medicine

## 2018-06-22 ENCOUNTER — Encounter (HOSPITAL_BASED_OUTPATIENT_CLINIC_OR_DEPARTMENT_OTHER): Payer: Self-pay | Admitting: *Deleted

## 2018-06-22 ENCOUNTER — Other Ambulatory Visit: Payer: Self-pay

## 2018-06-22 ENCOUNTER — Emergency Department (HOSPITAL_BASED_OUTPATIENT_CLINIC_OR_DEPARTMENT_OTHER)

## 2018-06-22 DIAGNOSIS — S99921A Unspecified injury of right foot, initial encounter: Secondary | ICD-10-CM | POA: Diagnosis present

## 2018-06-22 DIAGNOSIS — I10 Essential (primary) hypertension: Secondary | ICD-10-CM | POA: Insufficient documentation

## 2018-06-22 DIAGNOSIS — W010XXA Fall on same level from slipping, tripping and stumbling without subsequent striking against object, initial encounter: Secondary | ICD-10-CM | POA: Diagnosis not present

## 2018-06-22 DIAGNOSIS — Z79899 Other long term (current) drug therapy: Secondary | ICD-10-CM | POA: Diagnosis not present

## 2018-06-22 DIAGNOSIS — Y998 Other external cause status: Secondary | ICD-10-CM | POA: Diagnosis not present

## 2018-06-22 DIAGNOSIS — Y9289 Other specified places as the place of occurrence of the external cause: Secondary | ICD-10-CM | POA: Insufficient documentation

## 2018-06-22 DIAGNOSIS — Y9301 Activity, walking, marching and hiking: Secondary | ICD-10-CM | POA: Diagnosis not present

## 2018-06-22 DIAGNOSIS — Z96652 Presence of left artificial knee joint: Secondary | ICD-10-CM | POA: Diagnosis not present

## 2018-06-22 DIAGNOSIS — S92351A Displaced fracture of fifth metatarsal bone, right foot, initial encounter for closed fracture: Secondary | ICD-10-CM | POA: Insufficient documentation

## 2018-06-22 NOTE — ED Provider Notes (Addendum)
[facility] DEPARTMENT Provider Note   CSN: [id number] Arrival date & time: 06/22/18  0840     History   Chief Complaint Chief Complaint  Patient presents with  . Foot Injury    HPI Jillian Steele is a 57 y.o. female.  Pt presents to the ED today with right foot and ankle pain.  Pt said her dog has a cone on his neck b/c of a recent surgery.  She was trying to help the dog down the stairs and tripped and fell.  She was only able to walk by putting weight on her right foot.  She did hit her head, but had no loc and has no n/v, dizziness.  She is not on blood thinners.  She hit her right hip, but feels like it's in the "fat of her hip" and the hip joint is ok.     Past Medical History:  Diagnosis Date  . Abnormal Pap smear of cervix    hx cryotherapy to cervix in her early 20s--paps normal since  . Anxiety   . Depression   . Fibroid 11/2013   fundal - 2 1/2 cm sac mucosal  . Hormone replacement therapy (postmenopausal) 11/12/2011  . Hypertension     Patient Active Problem List   Diagnosis Date Noted  . Vitamin D deficiency 01/07/2018  . 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 Depression 03/26/2016  . BMI 32.0-32.9,adult 02/23/2016  . High blood pressure 02/23/2016  . Anxiety 11/12/2011    Past Surgical History:  Procedure Laterality Date  . ARM WOUND REPAIR / CLOSURE     left  . EYE SURGERY     Lasik  . KNEE ARTHROSCOPY Left 1993  . PARTIAL KNEE ARTHROPLASTY Left 05/12/2017   Procedure: UNICOMPARTMENTAL KNEE, Medially;  Surgeon: Paralee Cancel, MD;  Location: [facility];  Service: Orthopedics;  Laterality: Left;  90 mins  . TUBAL LIGATION  1997     OB History    Gravida  2   Para  2   Term  2   Preterm  0   AB  0   Living  2     SAB  0   TAB  0   Ectopic  0   Multiple  0   Live Births  2            Home Medications    Prior to Admission medications   Medication Sig Start Date End Date Taking? Authorizing Provider  ALPRAZolam Duanne Moron)  0.5 MG tablet TAKE 1 TABLET BY MOUTH EVERY DAY AS NEEDED ANXIETY 01/07/18   Harrison Mons, PA  amLODipine (NORVASC) 5 MG tablet Take 1.5 tablets (7.5 mg total) by mouth daily. 01/07/18   Harrison Mons, PA  buPROPion (WELLBUTRIN XL) 150 MG 24 hr tablet Take 3 tablets (450 mg total) by mouth daily. 02/10/18   Harrison Mons, PA  buPROPion 450 MG TB24 Take 450 mg by mouth daily. 01/07/18   Harrison Mons, PA  calcium carbonate (TUMS EX) 750 MG chewable tablet Chew 1 tablet by mouth 2 (two) times daily.    [provider]  escitalopram (LEXAPRO) 20 MG tablet Take 1 tablet (20 mg total) by mouth daily. 01/07/18   Harrison Mons, PA  Vitamin D, Ergocalciferol, (DRISDOL) 50000 units CAPS capsule Take 1 capsule (50,000 Units total) by mouth every 7 (seven) days. 01/07/18   Harrison Mons, PA    Family History Family History  Problem Relation Age of Onset  . Heart disease Mother   .  Thyroid disease Mother   . Hypertension Mother   . Stroke Mother   . Heart disease Father   . Hypertension Father   . Emphysema Father   . Heart disease Sister   . Multiple myeloma Sister   . Heart disease Brother 31       heart stents X 2  . Thyroid disease Brother   . Cancer Paternal Grandmother        lung cancer  . Cancer Paternal Grandfather        prostate cancer  . Nephrolithiasis Daughter   . Hearing loss Daughter        cochlear implant age 1  . Heart disease Brother        Rheumatic Heart Disease  . Prostate cancer Paternal Uncle   . Breast cancer Maternal Aunt        x2  . Colon cancer Neg Hx     Social History Social History   Tobacco Use  . Smoking status: Never Smoker  . Smokeless tobacco: Never Used  Substance Use Topics  . Alcohol use: No    Alcohol/week: 0.0 standard drinks  . Drug use: No     Allergies   Patient has no known allergies.   Review of Systems Review of Systems  Musculoskeletal:       Right foot pain  All other systems reviewed and are  negative.    Physical Exam Updated Vital Signs BP (!) 166/88   Pulse 88   Temp 99 F (37.2 C) (Oral)   Resp 18   Ht _0  (1.651 m)   Wt 93 kg   LMP 10/14/2010 (Approximate)   SpO2 96%   BMI 34.11 kg/m   Physical Exam  Constitutional: She is oriented to person, place, and time. She appears well-developed and well-nourished.  HENT:  Head: Normocephalic and atraumatic.  Right Ear: External ear normal.  Left Ear: External ear normal.  Nose: Nose normal.  Mouth/Throat: Oropharynx is clear and moist.  Eyes: Pupils are equal, round, and reactive to light. Conjunctivae and EOM are normal.  Neck: Normal range of motion. Neck supple.  Cardiovascular: Normal rate, regular rhythm, normal heart sounds and intact distal pulses.  Pulmonary/Chest: Effort normal and breath sounds normal.  Abdominal: Soft. Bowel sounds are normal.  Musculoskeletal:       Feet:  Neurological: She is alert and oriented to person, place, and time.  Skin: Skin is warm and dry. Capillary refill takes less than 2 seconds.  Psychiatric: She has a normal mood and affect. Her behavior is normal. Thought content normal.  Nursing note and vitals reviewed.    ED Treatments / Results  Labs (all labs ordered are listed, but only abnormal results are displayed) Labs Reviewed - No data to display  EKG None  Radiology Dg Ankle Complete Right  Result Date: 06/22/2018 CLINICAL DATA:  Pain following fall EXAM: RIGHT ANKLE - COMPLETE 3+ VIEW COMPARISON:  None. FINDINGS: Frontal, oblique, and lateral views obtained. There are small apparent avulsion is a arising from the dorsal distal talus and dorsal proximal navicular. Fracture of the proximal fifth metatarsal noted. No other evident fracture. No joint effusion. No appreciable joint space narrowing or erosion. Ankle mortise appears intact. There are posterior and inferior calcaneal spurs. IMPRESSION: Fracture proximal fifth metatarsal. Small avulsions arising from the  dorsal distal talus and dorsal proximal navicular. Ankle mortise appears intact. There are calcaneal spurs. Electronically Signed   By: Lowella Grip III M.D.  On: 06/22/2018 09:16   Dg Foot Complete Right  Result Date: 06/22/2018 CLINICAL DATA:  Pain following fall EXAM: RIGHT FOOT COMPLETE - 3+ VIEW COMPARISON:  October 06, 2012 FINDINGS: There is a fracture along the proximal most aspect of the fifth metatarsal with mildly displaced fracture fragments. There are apparent small avulsions arising from the dorsal distal talus and dorsal proximal navicular. No other evident fractures. No dislocation. No appreciable joint space narrowing or erosion. There is a small spur arising from the dorsal distal navicular. There are inferior and posterior calcaneal spurs. IMPRESSION: 1.  Mildly displaced fracture proximal fifth metatarsal. 2. Small avulsions arising from the dorsal distal talus and dorsal proximal navicular. 3.  Calcaneal spurs. 4.  No appreciable joint space narrowing or erosion. Electronically Signed   By: Lowella Grip III M.D.   On: 06/22/2018 09:15    Procedures Procedures (including critical care time)  Medications Ordered in ED Medications - No data to display   Initial Impression / Assessment and Plan / ED Course  I have reviewed the triage vital signs and the nursing notes.  Pertinent labs & imaging results that were available during my care of the patient were reviewed by me and considered in my medical decision making (see chart for details).     Pt's hip joint is non tender.  I don't think she needs a hip xray.  She did hit her head, but has no signs of brain injury and is not on blood thinners, so I don't think she needs a head CT.  She is placed in a Cam walker for her foot fx.  She is established with [facility], so she's instructed to f/u with them.  She did not want anything for pain while here or for home.  Return if worse.   Final Clinical Impressions(s) /  ED Diagnoses   Final diagnoses:  Closed displaced fracture of fifth metatarsal bone of right foot, initial encounter    ED Discharge Orders    None       Isla Pence, MD 06/22/18 [id number]    Isla Pence, MD 06/22/18 [phone]

## 2018-06-22 NOTE — ED Notes (Signed)
Patient transported to X-ray 

## 2018-06-22 NOTE — ED Triage Notes (Signed)
Pt c/o fall with right foot injury x 1 hr ago

## 2018-07-31 ENCOUNTER — Encounter

## 2018-08-04 ENCOUNTER — Encounter

## 2018-08-13 ENCOUNTER — Encounter: Admitting: Gastroenterology

## 2018-08-17 NOTE — Progress Notes (Signed)
57 y.o. [id number] Married Caucasian female here for annual exam.    No vaginal bleeding.  No hot flashes.  [facility].  Still bothering her.   PCP:  Daphane Shepherd, PA-C  Patient's last menstrual period was 10/14/2010 (approximate).           Sexually active: No. female The current method of family planning is tubal ligation.    Exercising: Yes.    walking Smoker:  no  Health Maintenance: Pap:    01/16/15, Negative with neg HR HPV, 11/26/11 Negative with neg HR HPV History of abnormal Pap:  yes, Hx cryotherapy 1980's [facility]: 08-19-18--[facility] Colonoscopy: 05/14/13 tubular adenoma repeat in 5 years.  Scheduled for 09/15/18 at [facility].  BMD:   n/a  Result  n/a TDaP:  01-22-17 Gardasil:   no HIV:01/16/15 - neg  Hep C: 01-19-16 Neg Screening Labs:  Hb today: PCP  Flu vaccine - declines.    reports that she has never smoked. She has never used smokeless tobacco. She reports that she does not drink alcohol or use drugs.  Past Medical History:  Diagnosis Date  . Abnormal Pap smear of cervix    hx cryotherapy to cervix in her early 20s--paps normal since  . Anxiety   . Depression   . Fibroid 11/2013   fundal - 2 1/2 cm sac mucosal  . Hormone replacement therapy (postmenopausal) 11/12/2011  . Hypertension     Past Surgical History:  Procedure Laterality Date  . ARM WOUND REPAIR / CLOSURE     left  . EYE SURGERY     Lasik  . KNEE ARTHROSCOPY Left 1993  . PARTIAL KNEE ARTHROPLASTY Left 05/12/2017   Procedure: UNICOMPARTMENTAL KNEE, Medially;  Surgeon: Paralee Cancel, MD;  Location: [facility];  Service: Orthopedics;  Laterality: Left;  90 mins  . TUBAL LIGATION  1997    Current Outpatient Medications  Medication Sig Dispense Refill  . ALPRAZolam (XANAX) 0.5 MG tablet TAKE 1 TABLET BY MOUTH EVERY DAY AS NEEDED ANXIETY 30 tablet 0  . amLODipine (NORVASC) 5 MG tablet Take 1.5 tablets (7.5 mg total) by mouth daily. 135 tablet 3  . buPROPion (WELLBUTRIN  XL) 150 MG 24 hr tablet Take 3 tablets (450 mg total) by mouth daily. 270 tablet 3  . calcium carbonate (TUMS EX) 750 MG chewable tablet Chew 1 tablet by mouth 2 (two) times daily.    Marland Kitchen escitalopram (LEXAPRO) 20 MG tablet Take 1 tablet (20 mg total) by mouth daily. 90 tablet 3  . Vitamin D, Ergocalciferol, (DRISDOL) 50000 units CAPS capsule Take 1 capsule (50,000 Units total) by mouth every 7 (seven) days. 12 capsule 3   No current facility-administered medications for this visit.     Family History  Problem Relation Age of Onset  . Heart disease Mother   . Thyroid disease Mother   . Hypertension Mother   . Stroke Mother   . Heart disease Father   . Hypertension Father   . Emphysema Father   . Heart disease Sister   . Multiple myeloma Sister   . Heart disease Brother 73       heart stents X 2  . Thyroid disease Brother   . Cancer Paternal Grandmother        lung cancer  . Cancer Paternal Grandfather        prostate cancer  . Nephrolithiasis Daughter   . Hearing loss Daughter  cochlear implant age 99  . Heart disease Brother        Rheumatic Heart Disease  . Prostate cancer Paternal Uncle   . Breast cancer Maternal Aunt        x2  . Colon cancer Neg Hx     Review of Systems  Musculoskeletal:       Muscle/joint pain  Psychiatric/Behavioral: The patient is nervous/anxious.        Depression  All other systems reviewed and are negative.   Exam:   BP (!) 142/80 (BP Location: Right Arm, Patient Position: Sitting, Cuff Size: Normal)   Pulse 66   Resp 16   Ht 5' 4.5" (1.638 m)   Wt 210 lb 6.4 oz (95.4 kg)   LMP 10/14/2010 (Approximate)   BMI 35.56 kg/m     General appearance: alert, cooperative and appears stated age Head: Normocephalic, without obvious abnormality, atraumatic Neck: no adenopathy, supple, symmetrical, trachea midline and thyroid normal to inspection and palpation Lungs: clear to auscultation bilaterally Breasts: normal appearance, no masses or  tenderness, No nipple retraction or dimpling, No nipple discharge or bleeding, No axillary or supraclavicular adenopathy Heart: regular rate and rhythm Abdomen: soft, non-tender; no masses, no organomegaly Extremities: extremities normal, atraumatic, no cyanosis or edema Skin: Skin color, texture, turgor normal. No rashes or lesions Lymph nodes: Cervical, supraclavicular, and axillary nodes normal. No abnormal inguinal nodes palpated Neurologic: Grossly normal  Pelvic: External genitalia:  no lesions              Urethra:  normal appearing urethra with no masses, tenderness or lesions              Bartholins and Skenes: normal                 Vagina: normal appearing vagina with normal color and discharge, no lesions              Cervix: no lesions              Pap taken: Yes.   Bimanual Exam:  Uterus:  normal size, contour, position, consistency, mobility, non-tender.  Exam limited by [facility].              Adnexa: no mass, fullness, tenderness              Rectal exam: Yes.  .  Confirms.              Anus:  normal sphincter tone, no lesions  Chaperone was present for exam.  Assessment:   Well woman visit with normal exam. Remote hx cryotherapy.  Fibroid.  Plan: Mammogram screening. Recommended self breast awareness. Pap and HR HPV as above. Guidelines for Calcium, Vitamin D, regular exercise program including cardiovascular and weight bearing exercise. Labs with PCP.  Follow up annually and prn.     After visit summary provided.

## 2018-08-19 ENCOUNTER — Other Ambulatory Visit: Payer: Self-pay

## 2018-08-19 ENCOUNTER — Ambulatory Visit (INDEPENDENT_AMBULATORY_CARE_PROVIDER_SITE_OTHER): Admitting: Obstetrics and Gynecology

## 2018-08-19 ENCOUNTER — Encounter: Payer: Self-pay | Admitting: Obstetrics and Gynecology

## 2018-08-19 ENCOUNTER — Other Ambulatory Visit (HOSPITAL_COMMUNITY)
Admission: RE | Admit: 2018-08-19 | Discharge: 2018-08-19 | Disposition: A | Discharge status: Home / Self Care | Source: Ambulatory Visit | Attending: Obstetrics and Gynecology | Admitting: Obstetrics and Gynecology

## 2018-08-19 VITALS — BP 142/80 | HR 66 | Resp 16 | Ht 64.5 in | Wt 210.4 lb

## 2018-08-19 DIAGNOSIS — Z01419 Encounter for gynecological examination (general) (routine) without abnormal findings: Secondary | ICD-10-CM

## 2018-08-19 NOTE — Patient Instructions (Signed)

## 2018-08-31 LAB — CYTOLOGY - PAP: HPV: NOT DETECTED

## 2018-09-01 ENCOUNTER — Ambulatory Visit (AMBULATORY_SURGERY_CENTER): Payer: Self-pay

## 2018-09-01 VITALS — Ht 65.0 in | Wt 209.6 lb

## 2018-09-01 DIAGNOSIS — Z86010 Personal history of colonic polyps: Secondary | ICD-10-CM

## 2018-09-01 MED ORDER — NA SULFATE-K SULFATE-MG SULF 17.5-3.13-1.6 GM/177ML PO SOLN: 1.0000 | Freq: Once | ORAL | 0 refills | Status: AC

## 2018-09-01 NOTE — Progress Notes (Signed)
Denies allergies to eggs or soy products. Denies complication of anesthesia or sedation. Denies use of weight loss medication. Denies use of O2.   Emmi instructions declined.  

## 2018-09-15 ENCOUNTER — Encounter: Payer: Self-pay | Admitting: Gastroenterology

## 2018-09-15 ENCOUNTER — Ambulatory Visit (AMBULATORY_SURGERY_CENTER): Admitting: Gastroenterology

## 2018-09-15 VITALS — BP 138/82 | HR 72 | Temp 96.0°F | Resp 22 | Ht 64.0 in | Wt 210.0 lb

## 2018-09-15 DIAGNOSIS — Z86010 Personal history of colonic polyps: Secondary | ICD-10-CM

## 2018-09-15 DIAGNOSIS — D125 Benign neoplasm of sigmoid colon: Secondary | ICD-10-CM

## 2018-09-15 DIAGNOSIS — K635 Polyp of colon: Secondary | ICD-10-CM | POA: Diagnosis not present

## 2018-09-15 DIAGNOSIS — D123 Benign neoplasm of transverse colon: Secondary | ICD-10-CM

## 2018-09-15 MED ORDER — SODIUM CHLORIDE 0.9 % IV SOLN: 500.0000 mL | Freq: Once | INTRAVENOUS | Status: DC

## 2018-09-15 NOTE — Progress Notes (Signed)
Pt's states no medical or surgical changes since previsit or office visit. 

## 2018-09-15 NOTE — Op Note (Signed)
[facility] Patient Name: Jillian Steele Procedure Date: 09/15/2018 9:39 AM MRN: [id number] Endoscopist: Mauri Pole , MD Age: 57 Referring MD:  Date of Birth: 25-Apr-1961 Gender: Female Account #: [id number] Procedure:                Colonoscopy Indications:              Surveillance: Personal history of adenomatous                            polyps on last colonoscopy > 5 years ago Medicines:                Monitored Anesthesia Care Procedure:                Pre-Anesthesia Assessment:                           - Prior to the procedure, a History and Physical                            was performed, and patient medications and                            allergies were reviewed. The patient's tolerance of                            previous anesthesia was also reviewed. The risks                            and benefits of the procedure and the sedation                            options and risks were discussed with the patient.                            All questions were answered, and informed consent                            was obtained. Prior Anticoagulants: The patient has                            taken no previous anticoagulant or antiplatelet                            agents. ASA Grade Assessment: II - A patient with                            mild systemic disease. After reviewing the risks                            and benefits, the patient was deemed in                            satisfactory condition to undergo the procedure.  After obtaining informed consent, the colonoscope                            was passed under direct vision. Throughout the                            procedure, the patient's blood pressure, pulse, and                            oxygen saturations were monitored continuously. The                            Colonoscope was introduced through the anus and                            advanced to the the  cecum, identified by                            appendiceal orifice and ileocecal valve. The                            colonoscopy was performed without difficulty. The                            patient tolerated the procedure well. The quality                            of the bowel preparation was good. The ileocecal                            valve, appendiceal orifice, and rectum were                            photographed. Scope In: 9:54:18 AM Scope Out: 10:09:01 AM Scope Withdrawal Time: 0 hours 9 minutes 47 seconds  Total Procedure Duration: 0 hours 14 minutes 43 seconds  Findings:                 The perianal and digital rectal examinations were                            normal.                           A 5 mm polyp was found in the transverse colon. The                            polyp was sessile. The polyp was removed with a                            cold snare. Resection and retrieval were complete.                           A 2 mm polyp was found in the sigmoid colon. The  polyp was sessile. The polyp was removed with a                            cold biopsy forceps. Resection and retrieval were                            complete.                           A few small-mouthed diverticula were found in the                            sigmoid colon and descending colon.                           Non-bleeding internal hemorrhoids were found during                            retroflexion. The hemorrhoids were small. Complications:            No immediate complications. Estimated Blood Loss:     Estimated blood loss was minimal. Impression:               - One 5 mm polyp in the transverse colon, removed                            with a cold snare. Resected and retrieved.                           - One 2 mm polyp in the sigmoid colon, removed with                            a cold biopsy forceps. Resected and retrieved.                            - Diverticulosis in the sigmoid colon and in the                            descending colon.                           - Non-bleeding internal hemorrhoids. Recommendation:           - Patient has a contact number available for                            emergencies. The signs and symptoms of potential                            delayed complications were discussed with the                            patient. Return to normal activities tomorrow.                            Written  discharge instructions were provided to the                            patient.                           - Resume previous diet.                           - Continue present medications.                           - Await pathology results.                           - Repeat colonoscopy in 5-10 years for surveillance                            based on pathology results. Mauri Pole, MD 09/15/2018 10:12:30 AM This report has been signed electronically.

## 2018-09-15 NOTE — Progress Notes (Signed)
Called to room to assist during endoscopic procedure.  Patient ID and intended procedure confirmed with present staff. Received instructions for my participation in the procedure from the performing physician.  

## 2018-09-15 NOTE — Patient Instructions (Signed)
YOU HAD AN ENDOSCOPIC PROCEDURE TODAY AT THE [facility]:   Refer to the procedure report that was given to you for any specific questions about what was found during the examination.  If the procedure report does not answer your questions, please call your gastroenterologist to clarify.  If you requested that your care partner not be given the details of your procedure findings, then the procedure report has been included in a sealed envelope for you to review at your convenience later.  YOU SHOULD EXPECT: Some feelings of bloating in the abdomen. Passage of more gas than usual.  Walking can help get rid of the air that was put into your GI tract during the procedure and reduce the bloating. If you had a lower endoscopy (such as a colonoscopy or flexible sigmoidoscopy) you may notice spotting of blood in your stool or on the toilet paper. If you underwent a bowel prep for your procedure, you may not have a normal bowel movement for a few days.  Please Note:  You might notice some irritation and congestion in your nose or some drainage.  This is from the oxygen used during your procedure.  There is no need for concern and it should clear up in a day or so.  SYMPTOMS TO REPORT IMMEDIATELY:   Following lower endoscopy (colonoscopy or flexible sigmoidoscopy):  Excessive amounts of blood in the stool  Significant tenderness or worsening of abdominal pains  Swelling of the abdomen that is new, acute  Fever of 100F or higher  For urgent or emergent issues, a gastroenterologist can be reached at any hour by calling [phone].   DIET:  We do recommend a small meal at first, but then you may proceed to your regular diet.  Drink plenty of fluids but you should avoid alcoholic beverages for 24 hours.  ACTIVITY:  You should plan to take it easy for the rest of today and you should NOT DRIVE or use heavy machinery until tomorrow (because of the sedation medicines used during the test).     FOLLOW UP: Our staff will call the number listed on your records the next business day following your procedure to check on you and address any questions or concerns that you may have regarding the information given to you following your procedure. If we do not reach you, we will leave a message.  However, if you are feeling well and you are not experiencing any problems, there is no need to return our call.  We will assume that you have returned to your regular daily activities without incident.  If any biopsies were taken you will be contacted by phone or by letter within the next 1-3 weeks.  Please call us at [phone] if you have not heard about the biopsies in 3 weeks.    SIGNATURES/CONFIDENTIALITY: You and/or your care partner have signed paperwork which will be entered into your electronic medical record.  These signatures attest to the fact that that the information above on your After Visit Summary has been reviewed and is understood.  Full responsibility of the confidentiality of this discharge information lies with you and/or your care-partner. 

## 2018-09-15 NOTE — Progress Notes (Signed)
A/ox3 pleased with MAC, report to RN 

## 2018-09-16 ENCOUNTER — Telehealth: Payer: Self-pay | Admitting: *Deleted

## 2018-09-16 NOTE — Telephone Encounter (Signed)
  Follow up Call-  Call back number 09/15/2018  Post procedure Call Back phone  # [phone]  Permission to leave phone message Yes  Some recent data might be hidden     Patient questions:  Do you have a fever, pain , or abdominal swelling? No. Pain Score  0 *  Have you tolerated food without any problems? Yes.    Have you been able to return to your normal activities? Yes.    Do you have any questions about your discharge instructions: Diet   No. Medications  No. Follow up visit  No.  Do you have questions or concerns about your Care? No.  Actions: * If pain score is 4 or above: No action needed, pain <4.

## 2018-09-28 ENCOUNTER — Encounter: Payer: Self-pay | Admitting: Gastroenterology

## 2019-02-21 IMAGING — CR DG FOOT COMPLETE 3+V*R*
3 series · 3 of 3 positions shown · non-contrast
Comparison: October 06, 2012

CLINICAL DATA: Pain following fall

EXAM:
RIGHT FOOT COMPLETE - 3+ VIEW

[t foot ap right]
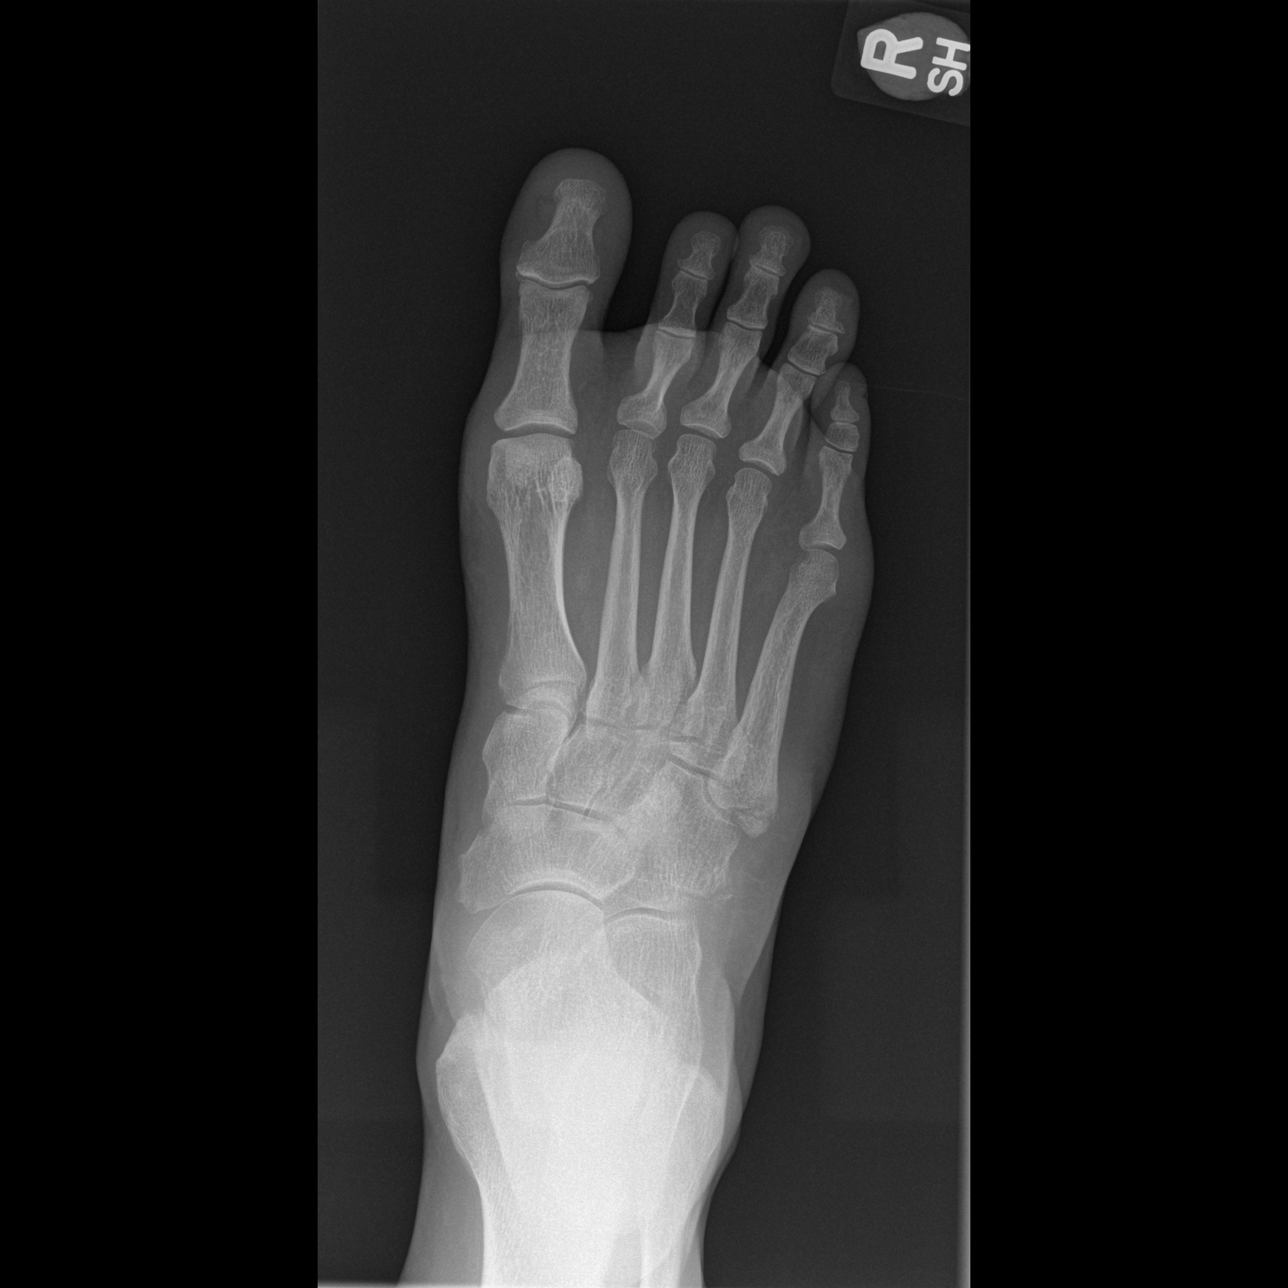

[t foot oblique right]
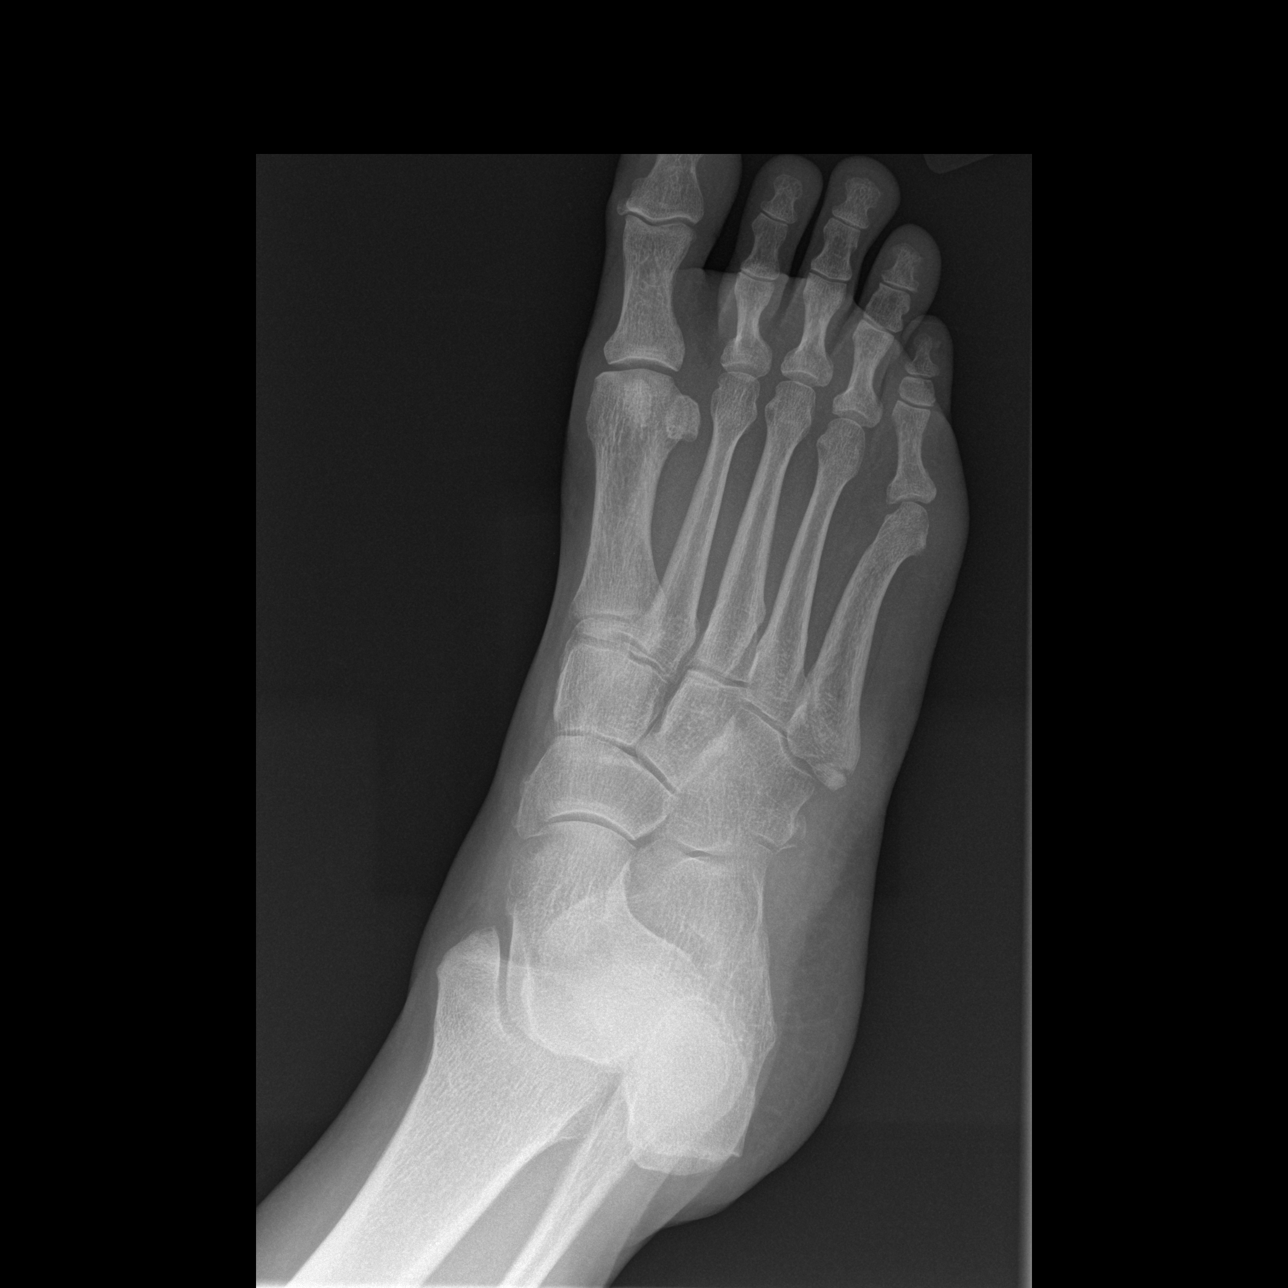

[t foot lat right]
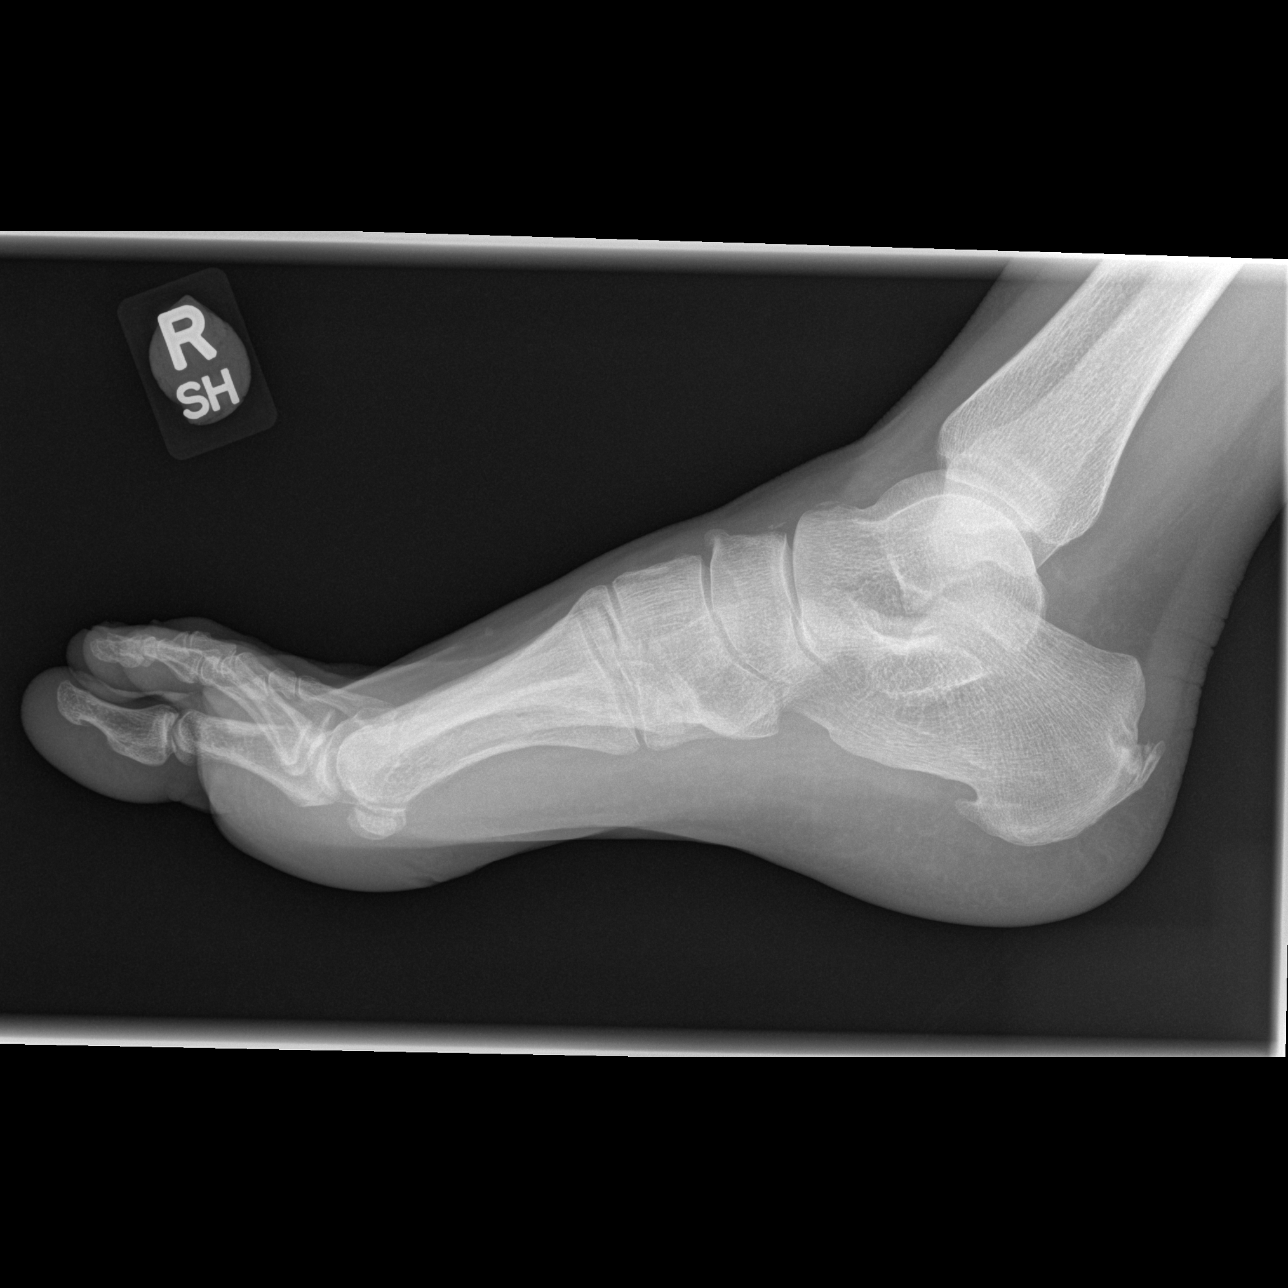

[3 of 3 positions shown; findings below may reference images not displayed]

FINDINGS: There is a fracture along the proximal most aspect of the fifth
metatarsal with mildly displaced fracture fragments. There are
apparent small avulsions arising from the dorsal distal talus and
dorsal proximal navicular. No other evident fractures. No
dislocation. No appreciable joint space narrowing or erosion. There
is a small spur arising from the dorsal distal navicular. There are
inferior and posterior calcaneal spurs.
IMPRESSION: 1.  Mildly displaced fracture proximal fifth metatarsal.

2. Small avulsions arising from the dorsal distal talus and dorsal
proximal navicular.

3.  Calcaneal spurs.

4.  No appreciable joint space narrowing or erosion.

## 2019-08-23 ENCOUNTER — Other Ambulatory Visit: Payer: Self-pay

## 2019-08-24 NOTE — Progress Notes (Signed)
58 y.o. G26P2002 Married Caucasian female here for annual exam.    Recently started having hot flashes again.   No vaginal bleeding or spotting.   Lost 13 pounds in the last 6 months by reducing carbs.  Exercising also with walking.   PCP:  Harrison Mons, PA   Patient's last menstrual period was 10/14/2010 (approximate).           Sexually active: No.  The current method of family planning is tubal ligation.    Exercising: Yes.    walking Smoker:  no  Health Maintenance: Pap:08-19-18 Neg:Neg HR HPV, 01-16-15 Neg:Neg HR HPV, 11-26-11 Neg:Neg HR HPV History of abnormal Pap:  Yes, Hx of cryotherapy to cervix as a teenager MMG: Pt. Had today!! Req.MMG from 2019 also. 05-16-16 3D/Diag.Bil/density A/benign intramammary node Rt.Br.& Lt.Br./Neg/Birads2 Colonoscopy: 09-15-18 polyps;next MIW:OEHO 09/2023 BMD:   n/a  Result  n/a TDaP:  01-22-17 Gardasil:   no HIV:01-16-15 Neg Hep C:01-19-16 Neg Screening Labs: PCP. Flu vaccine:  Recommended.    reports that she has never smoked. She has never used smokeless tobacco. She reports current alcohol use. She reports that she does not use drugs.  Past Medical History:  Diagnosis Date  . Abnormal Pap smear of cervix    hx cryotherapy to cervix in her early 20s--paps normal since  . Allergy   . Anxiety   . Arthritis   . Blood transfusion without reported diagnosis   . Depression   . Fibroid 11/2013   fundal - 2 1/2 cm sac mucosal  . GERD (gastroesophageal reflux disease)   . Hormone replacement therapy (postmenopausal) 11/12/2011  . Hypertension     Past Surgical History:  Procedure Laterality Date  . ARM WOUND REPAIR / CLOSURE     left  . EYE SURGERY     Lasik  . KNEE ARTHROSCOPY Left 1993  . PARTIAL KNEE ARTHROPLASTY Left 05/12/2017   Procedure: UNICOMPARTMENTAL KNEE, Medially;  Surgeon: Paralee Cancel, MD;  Location: [facility];  Service: Orthopedics;  Laterality: Left;  90 mins  . TUBAL LIGATION  1997    Current Outpatient Medications   Medication Sig Dispense Refill  . ALPRAZolam (XANAX) 0.5 MG tablet TAKE 1 TABLET BY MOUTH EVERY DAY AS NEEDED ANXIETY 30 tablet 0  . amLODipine (NORVASC) 5 MG tablet Take 1.5 tablets (7.5 mg total) by mouth daily. 135 tablet 3  . buPROPion (WELLBUTRIN XL) 150 MG 24 hr tablet Take 3 tablets (450 mg total) by mouth daily. 270 tablet 3  . calcium carbonate (TUMS EX) 750 MG chewable tablet Chew 1 tablet by mouth 2 (two) times daily.    Marland Kitchen escitalopram (LEXAPRO) 20 MG tablet Take 1 tablet (20 mg total) by mouth daily. 90 tablet 3  . Vitamin D, Ergocalciferol, (DRISDOL) 50000 units CAPS capsule Take 1 capsule (50,000 Units total) by mouth every 7 (seven) days. 12 capsule 3   No current facility-administered medications for this visit.     Family History  Problem Relation Age of Onset  . Heart disease Mother   . Thyroid disease Mother   . Hypertension Mother   . Stroke Mother   . Heart disease Father   . Hypertension Father   . Emphysema Father   . Heart disease Sister   . Multiple myeloma Sister   . Heart disease Brother 47       heart stents X 2  . Thyroid disease Brother   . Cancer Paternal Grandmother        lung cancer  .  Cancer Paternal Grandfather        prostate cancer  . Nephrolithiasis Daughter   . Hearing loss Daughter        cochlear implant age 16  . Heart disease Brother        Rheumatic Heart Disease  . Prostate cancer Paternal Uncle   . Breast cancer Maternal Aunt        x2  . Colon polyps Neg Hx   . Esophageal cancer Neg Hx   . Rectal cancer Neg Hx   . Stomach cancer Neg Hx     Review of Systems  All other systems reviewed and are negative.   Exam:   BP 140/78 (Cuff Size: Large)   Pulse 64   Temp 98 F (36.7 C) (Temporal)   Resp 16   Ht '5\' 5"'  (1.651 m)   Wt 197 lb 12.8 oz (89.7 kg)   LMP 10/14/2010 (Approximate)   BMI 32.92 kg/m     General appearance: alert, cooperative and appears stated age Head: normocephalic, without obvious abnormality,  atraumatic Neck: no adenopathy, supple, symmetrical, trachea midline and thyroid normal to inspection and palpation Lungs: clear to auscultation bilaterally Breasts: normal appearance, no masses or tenderness, No nipple retraction or dimpling, No nipple discharge or bleeding, No axillary adenopathy Heart: regular rate and rhythm Abdomen: soft, non-tender; no masses, no organomegaly Extremities: extremities normal, atraumatic, no cyanosis or edema Skin: skin color, texture, turgor normal. No rashes or lesions Lymph nodes: cervical, supraclavicular, and axillary nodes normal. Neurologic: grossly normal  Pelvic: External genitalia:  no lesions.  3 - 4 mm raised moderate brown nevus of the left labia majora.              No abnormal inguinal nodes palpated.              Urethra:  normal appearing urethra with no masses, tenderness or lesions              Bartholins and Skenes: normal                 Vagina: normal appearing vagina with normal color and discharge, no lesions              Cervix: no lesions              Pap taken: No. Bimanual Exam:  Uterus:  normal size, contour, position, consistency, mobility, non-tender.  Fundal fibroid about 3 cm.              Adnexa: no mass, fullness, tenderness              Rectal exam: Yes.  .  Confirms.              Anus:  normal sphincter tone, no lesions  Chaperone was present for exam.  Assessment:   Well woman visit with normal exam. Remote hx cryotherapy.  Fibroid. Left labial nevus.  Plan: Mammogram screening discussed. Self breast awareness reviewed. Pap and HR HPV 2024.  New guidelines discussed. Guidelines for Calcium, Vitamin D, regular exercise program including cardiovascular and weight bearing exercise. Fasting labs with PCP.  Follow up annually and prn.   After visit summary provided.

## 2019-08-25 ENCOUNTER — Encounter: Payer: Self-pay | Admitting: Obstetrics and Gynecology

## 2019-08-25 ENCOUNTER — Ambulatory Visit (INDEPENDENT_AMBULATORY_CARE_PROVIDER_SITE_OTHER): Admitting: Obstetrics and Gynecology

## 2019-08-25 ENCOUNTER — Other Ambulatory Visit: Payer: Self-pay

## 2019-08-25 VITALS — BP 140/78 | HR 64 | Temp 98.0°F | Resp 16 | Ht 65.0 in | Wt 197.8 lb

## 2019-08-25 DIAGNOSIS — Z01419 Encounter for gynecological examination (general) (routine) without abnormal findings: Secondary | ICD-10-CM | POA: Diagnosis not present

## 2019-08-25 NOTE — Patient Instructions (Signed)

## 2020-08-23 NOTE — Progress Notes (Deleted)
59 y.o. G20P2002 Married Caucasian female here for annual exam.    PCP:     Patient's last menstrual period was 10/14/2010 (approximate).           Sexually active: {yes no:314532}  The current method of family planning is tubal ligation.    Exercising: {yes no:314532}  {types:19826} Smoker:  no  Health Maintenance: Pap: 08-19-18 Neg:Neg HR HPV, 01-16-15 Neg:Neg HR HPV, 11-26-11 Neg:Neg HR HPV History of abnormal Pap:  Yes, Hx of cryotherapy to cervix as a teenager MMG:  ***08-25-19 3D/Neg/density A/BiRads1 Colonoscopy: 09-15-18 polyps;next MVV:KPQA 09/2023 BMD:   n/a  Result  n/a TDaP:  01-22-17 Gardasil:   no HIV: 01-16-15 Nr Hep C: 01-19-16 Neg Screening Labs:  Hb today: ***, Urine today: ***   reports that she has never smoked. She has never used smokeless tobacco. She reports current alcohol use. She reports that she does not use drugs.  Past Medical History:  Diagnosis Date  . Abnormal Pap smear of cervix    hx cryotherapy to cervix in her early 20s--paps normal since  . Allergy   . Anxiety   . Arthritis   . Blood transfusion without reported diagnosis   . Depression   . Fibroid 11/2013   fundal - 2 1/2 cm sac mucosal  . GERD (gastroesophageal reflux disease)   . Hormone replacement therapy (postmenopausal) 11/12/2011  . Hypertension     Past Surgical History:  Procedure Laterality Date  . ARM WOUND REPAIR / CLOSURE     left  . EYE SURGERY     Lasik  . KNEE ARTHROSCOPY Left 1993  . PARTIAL KNEE ARTHROPLASTY Left 05/12/2017   Procedure: UNICOMPARTMENTAL KNEE, Medially;  Surgeon: Paralee Cancel, MD;  Location: [facility];  Service: Orthopedics;  Laterality: Left;  90 mins  . TUBAL LIGATION  1997    Current Outpatient Medications  Medication Sig Dispense Refill  . ALPRAZolam (XANAX) 0.5 MG tablet TAKE 1 TABLET BY MOUTH EVERY DAY AS NEEDED ANXIETY 30 tablet 0  . amLODipine (NORVASC) 5 MG tablet Take 1.5 tablets (7.5 mg total) by mouth daily. 135 tablet 3  . buPROPion  (WELLBUTRIN XL) 150 MG 24 hr tablet Take 3 tablets (450 mg total) by mouth daily. 270 tablet 3  . calcium carbonate (TUMS EX) 750 MG chewable tablet Chew 1 tablet by mouth 2 (two) times daily.    Marland Kitchen escitalopram (LEXAPRO) 20 MG tablet Take 1 tablet (20 mg total) by mouth daily. 90 tablet 3  . Vitamin D, Ergocalciferol, (DRISDOL) 50000 units CAPS capsule Take 1 capsule (50,000 Units total) by mouth every 7 (seven) days. 12 capsule 3   No current facility-administered medications for this visit.    Family History  Problem Relation Age of Onset  . Heart disease Mother   . Thyroid disease Mother   . Hypertension Mother   . Stroke Mother   . Heart disease Father   . Hypertension Father   . Emphysema Father   . Heart disease Sister   . Multiple myeloma Sister   . Heart disease Brother 33       heart stents X 2  . Thyroid disease Brother   . Cancer Paternal Grandmother        lung cancer  . Cancer Paternal Grandfather        prostate cancer  . Nephrolithiasis Daughter   . Hearing loss Daughter        cochlear implant age 55  . Heart disease Brother  Rheumatic Heart Disease  . Prostate cancer Paternal Uncle   . Breast cancer Maternal Aunt        x2  . Colon polyps Neg Hx   . Esophageal cancer Neg Hx   . Rectal cancer Neg Hx   . Stomach cancer Neg Hx     Review of Systems  Exam:   LMP 10/14/2010 (Approximate)     General appearance: alert, cooperative and appears stated age Head: normocephalic, without obvious abnormality, atraumatic Neck: no adenopathy, supple, symmetrical, trachea midline and thyroid normal to inspection and palpation Lungs: clear to auscultation bilaterally Breasts: normal appearance, no masses or tenderness, No nipple retraction or dimpling, No nipple discharge or bleeding, No axillary adenopathy Heart: regular rate and rhythm Abdomen: soft, non-tender; no masses, no organomegaly Extremities: extremities normal, atraumatic, no cyanosis or  edema Skin: skin color, texture, turgor normal. No rashes or lesions Lymph nodes: cervical, supraclavicular, and axillary nodes normal. Neurologic: grossly normal  Pelvic: External genitalia:  no lesions              No abnormal inguinal nodes palpated.              Urethra:  normal appearing urethra with no masses, tenderness or lesions              Bartholins and Skenes: normal                 Vagina: normal appearing vagina with normal color and discharge, no lesions              Cervix: no lesions              Pap taken: {yes no:314532} Bimanual Exam:  Uterus:  normal size, contour, position, consistency, mobility, non-tender              Adnexa: no mass, fullness, tenderness              Rectal exam: {yes no:314532}.  Confirms.              Anus:  normal sphincter tone, no lesions  Chaperone was present for exam.  Assessment:   Well woman visit with normal exam.   Plan: Mammogram screening discussed. Self breast awareness reviewed. Pap and HR HPV as above. Guidelines for Calcium, Vitamin D, regular exercise program including cardiovascular and weight bearing exercise.   Follow up annually and prn.   Additional counseling given.  {yes [id number]. _______ minutes face to face time of which over 50% was spent in counseling.    After visit summary provided.

## 2020-08-28 ENCOUNTER — Ambulatory Visit: Admitting: Obstetrics and Gynecology

## 2020-09-05 ENCOUNTER — Other Ambulatory Visit: Payer: Self-pay

## 2020-09-05 ENCOUNTER — Ambulatory Visit: Admitting: Obstetrics and Gynecology

## 2020-09-05 ENCOUNTER — Encounter: Payer: Self-pay | Admitting: Obstetrics and Gynecology

## 2020-09-05 VITALS — BP 132/70 | HR 68 | Resp 16 | Ht 65.0 in | Wt 200.0 lb

## 2020-09-05 DIAGNOSIS — Z01419 Encounter for gynecological examination (general) (routine) without abnormal findings: Secondary | ICD-10-CM

## 2020-09-05 NOTE — Patient Instructions (Signed)

## 2020-09-05 NOTE — Progress Notes (Signed)
58 y.o. G71P2002 Married Caucasian female here for annual exam.    Denies vaginal bleeding or discharge. No bladder or bowel problems.   Having arthritis issues in her hands.   Did her Covid vaccine.  No flu vaccine.   Sells glass ware.   PCP: Harrison Mons, PA    Patient's last menstrual period was 10/14/2010 (approximate).           Sexually active: No.  The current method of family planning is tubal ligation.    Exercising: Yes.    walking.   Smoker:  no  Health Maintenance: Pap:  08/19/18 Neg:Neg HR HPV  01/16/15 Neg:Neg HR HPV  11/26/11 Neg:Neg HR HPV History of abnormal Pap:  Yes, hx of cryo to cervix as a teenager MMG:  08/25/19 BIRADS 1 negative/density a -- patient had MMG last week with [facility] Colonoscopy:  09/15/18 Polyps f/u 2024 BMD:   n/a  Result  n/a TDaP:  2018 Gardasil:   n/a HIV: 01/16/15 Hep C: 01/19/16 Neg Screening Labs:  PCP   reports that she has never smoked. She has never used smokeless tobacco. She reports current alcohol use. She reports that she does not use drugs.  Past Medical History:  Diagnosis Date  . Abnormal Pap smear of cervix    hx cryotherapy to cervix in her early 20s--paps normal since  . Allergy   . Anxiety   . Arthritis   . Blood transfusion without reported diagnosis   . Depression   . Fibroid 11/2013   fundal - 2 1/2 cm sac mucosal  . GERD (gastroesophageal reflux disease)   . Hormone replacement therapy (postmenopausal) 11/12/2011  . Hypertension     Past Surgical History:  Procedure Laterality Date  . ARM WOUND REPAIR / CLOSURE     left  . EYE SURGERY     Lasik  . KNEE ARTHROSCOPY Left 1993  . PARTIAL KNEE ARTHROPLASTY Left 05/12/2017   Procedure: UNICOMPARTMENTAL KNEE, Medially;  Surgeon: Paralee Cancel, MD;  Location: [facility];  Service: Orthopedics;  Laterality: Left;  90 mins  . TUBAL LIGATION  1997    Current Outpatient Medications  Medication Sig Dispense Refill  . ALPRAZolam (XANAX) 0.5 MG tablet TAKE 1 TABLET BY  MOUTH EVERY DAY AS NEEDED ANXIETY 30 tablet 0  . amLODipine (NORVASC) 5 MG tablet Take 1.5 tablets (7.5 mg total) by mouth daily. 135 tablet 3  . calcium carbonate (TUMS EX) 750 MG chewable tablet Chew 1 tablet by mouth 2 (two) times daily.     No current facility-administered medications for this visit.    Family History  Problem Relation Age of Onset  . Heart disease Mother   . Thyroid disease Mother   . Hypertension Mother   . Stroke Mother   . Heart disease Father   . Hypertension Father   . Emphysema Father   . Heart disease Sister   . Multiple myeloma Sister   . Heart disease Brother 71       heart stents X 2  . Thyroid disease Brother   . Cancer Paternal Grandmother        lung cancer  . Cancer Paternal Grandfather        prostate cancer  . Nephrolithiasis Daughter   . Hearing loss Daughter        cochlear implant age 28  . Heart disease Brother        Rheumatic Heart Disease  . Prostate cancer Paternal Uncle   . Breast cancer  Maternal Aunt        x2  . Colon polyps Neg Hx   . Esophageal cancer Neg Hx   . Rectal cancer Neg Hx   . Stomach cancer Neg Hx     Review of Systems  Constitutional: Negative.   HENT: Negative.   Eyes: Negative.   Respiratory: Negative.   Cardiovascular: Negative.   Gastrointestinal: Negative.   Endocrine: Negative.   Genitourinary: Negative.   Musculoskeletal: Negative.   Skin: Negative.   Allergic/Immunologic: Negative.   Neurological: Negative.   Hematological: Negative.   Psychiatric/Behavioral: Negative.     Exam:   BP 132/70 (BP Location: Right Arm, Patient Position: Sitting, Cuff Size: Normal)   Pulse 68   Resp 16   Ht '5\' 5"'  (1.651 m)   Wt 200 lb (90.7 kg)   LMP 10/14/2010 (Approximate)   BMI 33.28 kg/m     General appearance: alert, cooperative and appears stated age Head: normocephalic, without obvious abnormality, atraumatic Neck: no adenopathy, supple, symmetrical, trachea midline and thyroid normal to  inspection and palpation Lungs: clear to auscultation bilaterally Breasts: normal appearance, no masses or tenderness, No nipple retraction or dimpling, No nipple discharge or bleeding, No axillary adenopathy Heart: regular rate and rhythm Abdomen: soft, non-tender; no masses, no organomegaly Extremities: extremities normal, atraumatic, no cyanosis or edema Skin: skin color, texture, turgor normal. No rashes or lesions Lymph nodes: cervical, supraclavicular, and axillary nodes normal. Neurologic: grossly normal  Pelvic: External genitalia:   4 mm raised mod brown, well circumscribed nevus of left labia majora.               No abnormal inguinal nodes palpated.              Urethra:  normal appearing urethra with no masses, tenderness or lesions              Bartholins and Skenes: normal                 Vagina: normal appearing vagina with normal color and discharge, no lesions              Cervix: no lesions              Pap taken: No. Bimanual Exam:  Uterus:  normal size, contour, position, consistency, mobility, non-tender              Adnexa: no mass, fullness, tenderness              Rectal exam: Yes.  .  Confirms.              Anus:  normal sphincter tone, no lesions  Chaperone was present for exam.  Assessment:   Well woman visit with normal exam. Remote hx cryotherapy.  Fibroid. Left labial nevus.  Plan: Mammogram screening discussed.  Will get copy of report.  Self breast awareness reviewed. Pap and HR HPV 2024.  Guidelines for Calcium, Vitamin D, regular exercise program including cardiovascular and weight bearing exercise. Labs with PCP.  Follow up annually and prn.

## 2020-11-28 ENCOUNTER — Ambulatory Visit: Admitting: Obstetrics and Gynecology

## 2021-01-24 ENCOUNTER — Other Ambulatory Visit: Payer: Self-pay

## 2021-01-24 ENCOUNTER — Emergency Department (HOSPITAL_BASED_OUTPATIENT_CLINIC_OR_DEPARTMENT_OTHER)
Admission: EM | Admit: 2021-01-24 | Discharge: 2021-01-24 | Disposition: A | Discharge status: Home / Self Care | Attending: Emergency Medicine | Admitting: Emergency Medicine

## 2021-01-24 ENCOUNTER — Emergency Department (HOSPITAL_BASED_OUTPATIENT_CLINIC_OR_DEPARTMENT_OTHER)

## 2021-01-24 ENCOUNTER — Encounter (HOSPITAL_BASED_OUTPATIENT_CLINIC_OR_DEPARTMENT_OTHER): Payer: Self-pay | Admitting: *Deleted

## 2021-01-24 DIAGNOSIS — M7989 Other specified soft tissue disorders: Secondary | ICD-10-CM | POA: Diagnosis not present

## 2021-01-24 DIAGNOSIS — I10 Essential (primary) hypertension: Secondary | ICD-10-CM

## 2021-01-24 DIAGNOSIS — Z96652 Presence of left artificial knee joint: Secondary | ICD-10-CM | POA: Insufficient documentation

## 2021-01-24 DIAGNOSIS — S60031A Contusion of right middle finger without damage to nail, initial encounter: Secondary | ICD-10-CM

## 2021-01-24 DIAGNOSIS — Z79899 Other long term (current) drug therapy: Secondary | ICD-10-CM | POA: Diagnosis not present

## 2021-01-24 DIAGNOSIS — M79644 Pain in right finger(s): Secondary | ICD-10-CM | POA: Diagnosis present

## 2021-01-24 MED ORDER — IBUPROFEN 800 MG PO TABS
800.0000 mg | ORAL_TABLET | Freq: Once | ORAL | Status: AC
  Administered 2021-01-24: 800 mg via ORAL
  Filled 2021-01-24: qty 1

## 2021-01-24 NOTE — ED Provider Notes (Signed)
[facility] EMERGENCY DEPARTMENT Provider Note   CSN: [id number] Arrival date & time: 01/24/21  1821     History Chief Complaint  Patient presents with  . Hand Pain    Jillian Steele is a 60 y.o. female history of hypertension here presenting with right hand pain.  Patient has been packing recently.  She has been picking up heavy items.  She noticed right third finger swelling and pain today.  Denies any actual trauma or injury.  No meds prior to arrival.  Patient has a history of hypertension but usually takes her meds in the evening.  Denies any chest pain shortness of breath or abdominal pain  The history is provided by the patient.       Past Medical History:  Diagnosis Date  . Abnormal Pap smear of cervix    hx cryotherapy to cervix in her early 20s--paps normal since  . Allergy   . Anxiety   . Arthritis   . Blood transfusion without reported diagnosis   . Depression   . Fibroid 11/2013   fundal - 2 1/2 cm sac mucosal  . GERD (gastroesophageal reflux disease)   . Hormone replacement therapy (postmenopausal) 11/12/2011  . Hypertension     Patient Active Problem List   Diagnosis Date Noted  . Vitamin D deficiency 01/07/2018  . 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 Depression 03/26/2016  . BMI 32.0-32.9,adult 02/23/2016  . High blood pressure 02/23/2016  . Anxiety 11/12/2011    Past Surgical History:  Procedure Laterality Date  . ARM WOUND REPAIR / CLOSURE     left  . EYE SURGERY     Lasik  . KNEE ARTHROSCOPY Left 1993  . PARTIAL KNEE ARTHROPLASTY Left 05/12/2017   Procedure: UNICOMPARTMENTAL KNEE, Medially;  Surgeon: Paralee Cancel, MD;  Location: [facility];  Service: Orthopedics;  Laterality: Left;  90 mins  . TUBAL LIGATION  1997     OB History    Gravida  2   Para  2   Term  2   Preterm  0   AB  0   Living  2     SAB  0   IAB  0   Ectopic  0   Multiple  0   Live Births  2           Family History  Problem Relation Age of Onset  .  Heart disease Mother   . Thyroid disease Mother   . Hypertension Mother   . Stroke Mother   . Heart disease Father   . Hypertension Father   . Emphysema Father   . Heart disease Sister   . Multiple myeloma Sister   . Heart disease Brother 3       heart stents X 2  . Thyroid disease Brother   . Cancer Paternal Grandmother        lung cancer  . Cancer Paternal Grandfather        prostate cancer  . Nephrolithiasis Daughter   . Hearing loss Daughter        cochlear implant age 46  . Heart disease Brother        Rheumatic Heart Disease  . Prostate cancer Paternal Uncle   . Breast cancer Maternal Aunt        x2  . Colon polyps Neg Hx   . Esophageal cancer Neg Hx   . Rectal cancer Neg Hx   . Stomach cancer Neg Hx     Social  History   Tobacco Use  . Smoking status: Never Smoker  . Smokeless tobacco: Never Used  Vaping Use  . Vaping Use: Never used  Substance Use Topics  . Alcohol use: Not Currently    Alcohol/week: 0.0 standard drinks    Comment: Rarely   . Drug use: No    Home Medications Prior to Admission medications   Medication Sig Start Date End Date Taking? Authorizing Provider  ALPRAZolam Duanne Moron) 0.5 MG tablet TAKE 1 TABLET BY MOUTH EVERY DAY AS NEEDED ANXIETY 01/07/18   Harrison Mons, PA  amLODipine (NORVASC) 5 MG tablet Take 1.5 tablets (7.5 mg total) by mouth daily. 01/07/18   Harrison Mons, PA  calcium carbonate (TUMS EX) 750 MG chewable tablet Chew 1 tablet by mouth 2 (two) times daily.    [provider]    Allergies    Patient has no known allergies.  Review of Systems   Review of Systems  Musculoskeletal:       R third finger pain   All other systems reviewed and are negative.   Physical Exam Updated Vital Signs BP (!) 179/84   Pulse 67   Temp 98.1 F (36.7 C) (Oral)   Resp 16   Ht '5\' 5"'  (1.651 m)   Wt 91.2 kg   LMP 10/14/2010 (Approximate)   SpO2 97%   BMI 33.45 kg/m   Physical Exam Vitals and nursing note reviewed.   Constitutional:      Appearance: Normal appearance.  HENT:     Head: Normocephalic.     Nose: Nose normal.     Mouth/Throat:     Mouth: Mucous membranes are moist.  Eyes:     Extraocular Movements: Extraocular movements intact.     Pupils: Pupils are equal, round, and reactive to light.  Cardiovascular:     Rate and Rhythm: Normal rate.     Pulses: Normal pulses.  Pulmonary:     Effort: Pulmonary effort is normal.  Abdominal:     General: Abdomen is flat.     Palpations: Abdomen is soft.  Musculoskeletal:     Cervical back: Normal range of motion.     Comments: Right third finger slightly swollen.  She is able to flex and extend at the PIP and DIP joint and the MCP joint.  No focal joint swelling.  Normal capillary refill in the third finger and otherwise neurovascular intact in the right hand  Skin:    General: Skin is warm.     Capillary Refill: Capillary refill takes less than 2 seconds.  Neurological:     General: No focal deficit present.     Mental Status: She is alert and oriented to person, place, and time.  Psychiatric:        Mood and Affect: Mood normal.        Behavior: Behavior normal.     ED Results / Procedures / Treatments   Labs (all labs ordered are listed, but only abnormal results are displayed) Labs Reviewed - No data to display  EKG None  Radiology DG Finger Middle Right  Result Date: 01/24/2021 CLINICAL DATA:  Third right finger pain and swelling. EXAM: RIGHT MIDDLE FINGER 2+V COMPARISON:  None. FINDINGS: There is no evidence of fracture or dislocation. There is no evidence of arthropathy or other focal bone abnormality. Soft tissues are unremarkable. IMPRESSION: Negative. Electronically Signed   By: Virgina Norfolk M.D.   On: 01/24/2021 19:37    Procedures Procedures   Medications Ordered in  ED Medications  ibuprofen (ADVIL) tablet 800 mg (800 mg Oral Given 01/24/21 1856)    ED Course  I have reviewed the triage vital signs and the  nursing notes.  Pertinent labs & imaging results that were available during my care of the patient were reviewed by me and considered in my medical decision making (see chart for details).    MDM Rules/Calculators/A&P                         Jillian Steele is a 60 y.o. female here presenting with third finger pain.  Likely finger sprain from lifting heavy boxes.  Will get x-rays and give Motrin.  7:43 PM X-rays did not show any fracture.  Likely hand contusion.  Stable for discharge.   Final Clinical Impression(s) / ED Diagnoses Final diagnoses:  None    Rx / DC Orders ED Discharge Orders    None       Drenda Freeze, MD 01/24/21 1944

## 2021-01-24 NOTE — Discharge Instructions (Signed)
Your x-rays did not show any fractures.  Please take Tylenol or Motrin for pain.  Please apply ice to help with swelling  See your doctor for follow up   Return to ER if you have worse finger pain or swelling, fingers turning blue

## 2021-01-24 NOTE — ED Triage Notes (Signed)
C/o right 3rd finger swelling,  purple in color   and pain x 1 day , denies injury

## 2021-09-12 NOTE — Progress Notes (Deleted)
60 y.o. G69P2002 Married Caucasian female here for annual exam.    PCP:     Patient's last menstrual period was 10/14/2010 (approximate).           Sexually active: {yes no:314532}  The current method of family planning is tubal ligation.    Exercising: {yes no:314532}  {types:19826} Smoker:  no  Health Maintenance: Pap:   08/19/18 Neg:Neg HR HPV, 01/16/15 Neg:Neg HR HPV, 11/26/11 Neg:Neg HR HPV History of abnormal Pap:  Yes, hx of cryo to cervix as a teenager MMG:  ***08-30-20 3D/Neg/Birads1 Colonoscopy:  09/15/18 Polyps f/u 2024 BMD:   ***  Result  *** TDaP:  2018 Gardasil:   no HIV: 01-16-15 Hep C: 01-19-16 Neg Screening Labs:  Hb today: ***, Urine today: ***   reports that she has never smoked. She has never used smokeless tobacco. She reports that she does not currently use alcohol. She reports that she does not use drugs.  Past Medical History:  Diagnosis Date   Abnormal Pap smear of cervix    hx cryotherapy to cervix in her early 20s--paps normal since   Allergy    Anxiety    Arthritis    Blood transfusion without reported diagnosis    Depression    Fibroid 11/2013   fundal - 2 1/2 cm sac mucosal   GERD (gastroesophageal reflux disease)    Hormone replacement therapy (postmenopausal) 11/12/2011   Hypertension     Past Surgical History:  Procedure Laterality Date   ARM WOUND REPAIR / CLOSURE     left   EYE SURGERY     Lasik   KNEE ARTHROSCOPY Left 1993   PARTIAL KNEE ARTHROPLASTY Left 05/12/2017   Procedure: UNICOMPARTMENTAL KNEE, Medially;  Surgeon: Paralee Cancel, MD;  Location: [facility];  Service: Orthopedics;  Laterality: Left;  90 mins   TUBAL LIGATION  1997    Current Outpatient Medications  Medication Sig Dispense Refill   ALPRAZolam (XANAX) 0.5 MG tablet TAKE 1 TABLET BY MOUTH EVERY DAY AS NEEDED ANXIETY 30 tablet 0   amLODipine (NORVASC) 5 MG tablet Take 1.5 tablets (7.5 mg total) by mouth daily. 135 tablet 3   calcium carbonate (TUMS EX) 750 MG chewable  tablet Chew 1 tablet by mouth 2 (two) times daily.     No current facility-administered medications for this visit.    Family History  Problem Relation Age of Onset   Heart disease Mother    Thyroid disease Mother    Hypertension Mother    Stroke Mother    Heart disease Father    Hypertension Father    Emphysema Father    Heart disease Sister    Multiple myeloma Sister    Heart disease Brother 6       heart stents X 2   Thyroid disease Brother    Cancer Paternal Grandmother        lung cancer   Cancer Paternal Grandfather        prostate cancer   Nephrolithiasis Daughter    Hearing loss Daughter        cochlear implant age 70   Heart disease Brother        Rheumatic Heart Disease   Prostate cancer Paternal Uncle    Breast cancer Maternal Aunt        x2   Colon polyps Neg Hx    Esophageal cancer Neg Hx    Rectal cancer Neg Hx    Stomach cancer Neg Hx     Review  of Systems  Exam:   LMP 10/14/2010 (Approximate)     General appearance: alert, cooperative and appears stated age Head: normocephalic, without obvious abnormality, atraumatic Neck: no adenopathy, supple, symmetrical, trachea midline and thyroid normal to inspection and palpation Lungs: clear to auscultation bilaterally Breasts: normal appearance, no masses or tenderness, No nipple retraction or dimpling, No nipple discharge or bleeding, No axillary adenopathy Heart: regular rate and rhythm Abdomen: soft, non-tender; no masses, no organomegaly Extremities: extremities normal, atraumatic, no cyanosis or edema Skin: skin color, texture, turgor normal. No rashes or lesions Lymph nodes: cervical, supraclavicular, and axillary nodes normal. Neurologic: grossly normal  Pelvic: External genitalia:  no lesions              No abnormal inguinal nodes palpated.              Urethra:  normal appearing urethra with no masses, tenderness or lesions              Bartholins and Skenes: normal                 Vagina:  normal appearing vagina with normal color and discharge, no lesions              Cervix: no lesions              Pap taken: {yes no:314532} Bimanual Exam:  Uterus:  normal size, contour, position, consistency, mobility, non-tender              Adnexa: no mass, fullness, tenderness              Rectal exam: {yes no:314532}.  Confirms.              Anus:  normal sphincter tone, no lesions  Chaperone was present for exam:  ***  Assessment:   Well woman visit with gynecologic exam.   Plan: Mammogram screening discussed. Self breast awareness reviewed. Pap and HR HPV as above. Guidelines for Calcium, Vitamin D, regular exercise program including cardiovascular and weight bearing exercise.   Follow up annually and prn.   Additional counseling given.  {yes [id number]. _______ minutes face to face time of which over 50% was spent in counseling.    After visit summary provided.

## 2021-09-17 ENCOUNTER — Ambulatory Visit: Admitting: Obstetrics and Gynecology

## 2021-09-25 IMAGING — DX DG FINGER MIDDLE 2+V*R*
3 series · 3 of 3 positions shown · non-contrast
Comparison: None.

CLINICAL DATA: Third right finger pain and swelling.

EXAM:
RIGHT MIDDLE FINGER 2+V

[finger ap]
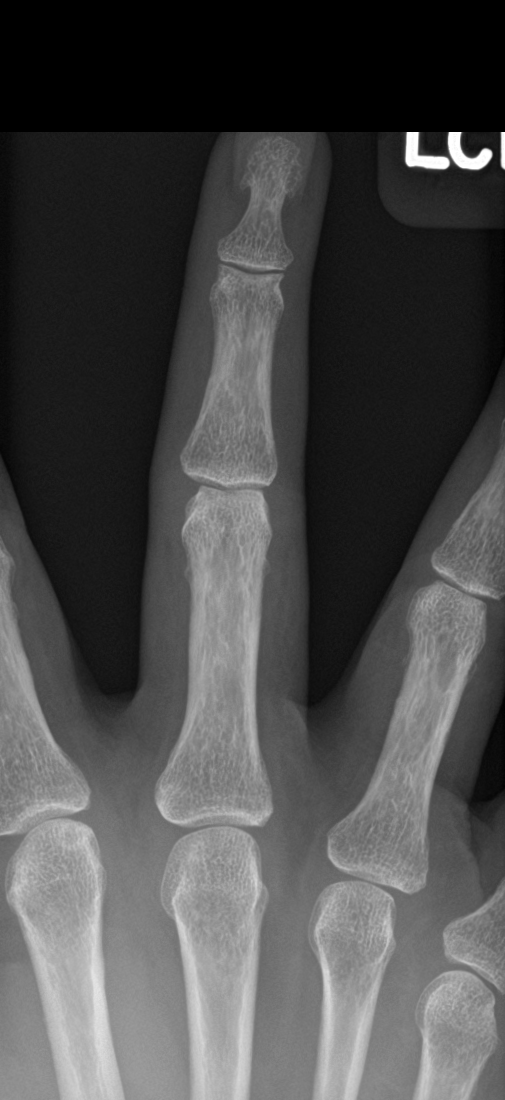

[finger obl]
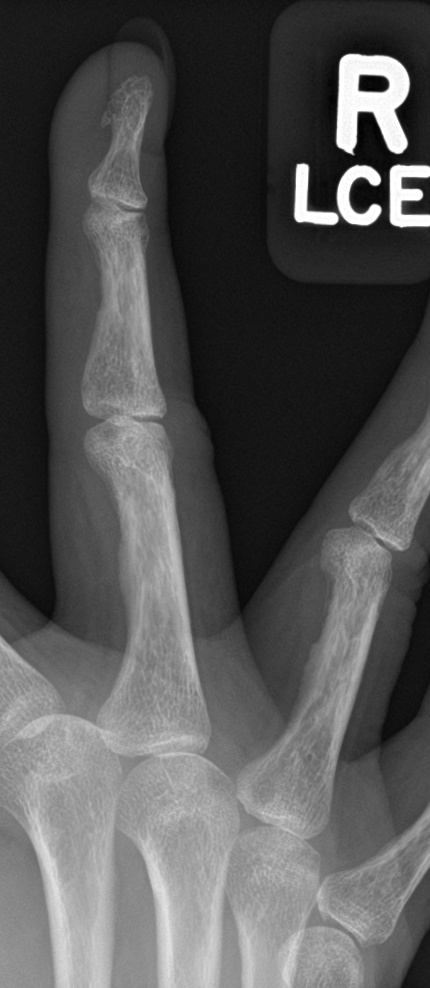

[finger lat]
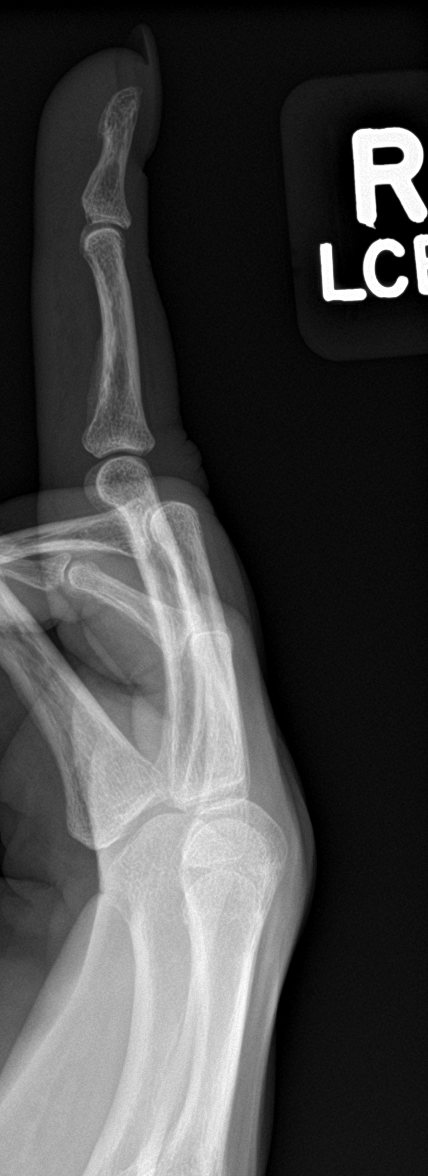

[3 of 3 positions shown; findings below may reference images not displayed]

FINDINGS: There is no evidence of fracture or dislocation. There is no
evidence of arthropathy or other focal bone abnormality. Soft
tissues are unremarkable.
IMPRESSION: Negative.
